# Patient Record
Sex: Male | Born: 2000 | Race: White | Hispanic: No | Marital: Single | Smoking: Never smoker
Health system: Southern US, Community
[De-identification: ages and names within clinical notes are randomized; demographics above are authoritative.]

## PROBLEM LIST (undated history)

## (undated) DIAGNOSIS — F419 Anxiety disorder, unspecified: Secondary | ICD-10-CM

## (undated) DIAGNOSIS — T7840XA Allergy, unspecified, initial encounter: Secondary | ICD-10-CM

## (undated) DIAGNOSIS — R55 Syncope and collapse: Secondary | ICD-10-CM

## (undated) DIAGNOSIS — K219 Gastro-esophageal reflux disease without esophagitis: Secondary | ICD-10-CM

## (undated) DIAGNOSIS — J45909 Unspecified asthma, uncomplicated: Secondary | ICD-10-CM

## (undated) DIAGNOSIS — F32A Depression, unspecified: Secondary | ICD-10-CM

## (undated) DIAGNOSIS — F332 Major depressive disorder, recurrent severe without psychotic features: Secondary | ICD-10-CM

## (undated) DIAGNOSIS — R45851 Suicidal ideations: Secondary | ICD-10-CM

## (undated) HISTORY — PX: TYMPANOSTOMY TUBE PLACEMENT: SHX32

## (undated) HISTORY — DX: Major depressive disorder, recurrent severe without psychotic features: F33.2

## (undated) HISTORY — DX: Suicidal ideations: R45.851

## (undated) HISTORY — PX: WISDOM TOOTH EXTRACTION: SHX21

## (undated) HISTORY — DX: Gastro-esophageal reflux disease without esophagitis: K21.9

## (undated) HISTORY — DX: Allergy, unspecified, initial encounter: T78.40XA

## (undated) HISTORY — PX: CIRCUMCISION: SUR203

## (undated) HISTORY — DX: Unspecified asthma, uncomplicated: J45.909

## (undated) HISTORY — PX: ADENOIDECTOMY AND MYRINGOTOMY WITH TUBE PLACEMENT: SHX5714

## (undated) HISTORY — PX: TYMPANOPLASTY: SHX33

## (undated) HISTORY — DX: Syncope and collapse: R55

## (undated) HISTORY — DX: Depression, unspecified: F32.A

## (undated) HISTORY — DX: Anxiety disorder, unspecified: F41.9

---
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
## 2020-06-26 ENCOUNTER — Telehealth: Payer: Self-pay | Admitting: Pediatrics

## 2020-06-26 NOTE — Telephone Encounter (Signed)
Yes, this is okay. Thanks for checking!

## 2020-06-26 NOTE — Telephone Encounter (Signed)
Pt called stating that you spoke with his mom [name] About pt establishing with you  Ok to schedule

## 2020-06-28 NOTE — Telephone Encounter (Signed)
Called patient and scheduled new patient appointment.

## 2020-07-15 ENCOUNTER — Encounter: Payer: Self-pay | Admitting: Primary Care

## 2020-07-15 ENCOUNTER — Other Ambulatory Visit: Payer: Self-pay

## 2020-07-15 ENCOUNTER — Ambulatory Visit (INDEPENDENT_AMBULATORY_CARE_PROVIDER_SITE_OTHER): Payer: BC Managed Care – PPO | Admitting: Primary Care

## 2020-07-15 VITALS — BP 116/82 | HR 79 | Temp 98.3°F | Ht 69.69 in | Wt 139.2 lb

## 2020-07-15 DIAGNOSIS — F419 Anxiety disorder, unspecified: Secondary | ICD-10-CM | POA: Insufficient documentation

## 2020-07-15 DIAGNOSIS — F900 Attention-deficit hyperactivity disorder, predominantly inattentive type: Secondary | ICD-10-CM | POA: Diagnosis not present

## 2020-07-15 DIAGNOSIS — Z23 Encounter for immunization: Secondary | ICD-10-CM

## 2020-07-15 DIAGNOSIS — K219 Gastro-esophageal reflux disease without esophagitis: Secondary | ICD-10-CM | POA: Diagnosis not present

## 2020-07-15 DIAGNOSIS — F32A Depression, unspecified: Secondary | ICD-10-CM

## 2020-07-15 DIAGNOSIS — Z0000 Encounter for general adult medical examination without abnormal findings: Secondary | ICD-10-CM

## 2020-07-15 DIAGNOSIS — Q676 Pectus excavatum: Secondary | ICD-10-CM

## 2020-07-15 NOTE — Progress Notes (Signed)
Subjective:    Patient ID: Jesse Schultz, male    DOB: 2000-12-29, 19 y.o.   MRN: [id number]  HPI  This visit occurred during the SARS-CoV-2 public health emergency.  Safety protocols were in place, including screening questions prior to the visit, additional usage of staff PPE, and extensive cleaning of exam room while observing appropriate contact time as indicated for disinfecting solutions.   Mr. Jesse Schultz is a 19 year old male who presents today to establish care and for complete physical. Will obtain/review records.  1) ADHD/Depression/Anxiety: Currently managed on Abilify 5 mg, Zoloft 100 mg, hydroxyzine 10 mg PRN. Following with Dr. Elpidio Anis in [place] for medication management and also his therapist every few weeks. He does have residual symptoms of anxiety and depression, his therapist is questioning whether he may have bipolar disorder.  2) Pectus excavatum:  Underwent NUSS procedure on 09/18/19 through [facility]. Following with Dr. Milana Kidney with last visit being in October 2021. The rod is planned to be removed in spring 2023. Overall he endorses doing well since surgery.  3) GERD: Chronic, now managed on omeprazole 20 mg for which he takes daily. Symptoms include belching, throat fullness. He was once managed on Zantac 150 milligrams which was effective until it was recalled.   Immunizations: -Tetanus: Completed in 2020 -Influenza: Due today -HPV: Completed series  Diet: He endorses a fair diet.  Exercise: No regular exercise  Eye exam: Completed in 2020 Dental exam: Completes annually    Review of Systems  Constitutional: Negative for unexpected weight change.  HENT: Negative for rhinorrhea.   Eyes: Negative for visual disturbance.  Respiratory: Negative for cough and shortness of breath.   Cardiovascular: Negative for chest pain.  Gastrointestinal: Negative for constipation and diarrhea.  Genitourinary: Negative for difficulty urinating.  Musculoskeletal:  Negative for arthralgias and myalgias.  Skin: Negative for rash.  Allergic/Immunologic: Negative for environmental allergies.  Neurological: Negative for dizziness and headaches.  Psychiatric/Behavioral: The patient is nervous/anxious.        Past Medical History:  Diagnosis Date  . Allergy   . Anxiety   . Asthma   . Depression   . Fainting spell   . GERD (gastroesophageal reflux disease)      Social History   Socioeconomic History  . Marital status: Single    Spouse name: Not on file  . Number of children: Not on file  . Years of education: Not on file  . Highest education level: Not on file  Occupational History  . Not on file  Tobacco Use  . Smoking status: Never Smoker  . Smokeless tobacco: Never Used  Substance and Sexual Activity  . Alcohol use: No  . Drug use: No  . Sexual activity: Never    Birth control/protection: Abstinence  Other Topics Concern  . Not on file  Social History Narrative   Cadell is in ninth grade at [employer]. He is meeting most of the goals on his IEP.He struggles with math.   Wilber lives with his parents, and two brothers.   Social Determinants of Health   Financial Resource Strain:   . Difficulty of Paying Living Expenses: Not on file  Food Insecurity:   . Worried About Programme researcher, broadcasting/film/video in the Last Year: Not on file  . Ran Out of Food in the Last Year: Not on file  Transportation Needs:   . Lack of Transportation (Medical): Not on file  . Lack of Transportation (Non-Medical): Not on file  Physical Activity:   . Days of Exercise per Week: Not on file  . Minutes of Exercise per Session: Not on file  Stress:   . Feeling of Stress : Not on file  Social Connections:   . Frequency of Communication with Friends and Family: Not on file  . Frequency of Social Gatherings with Friends and Family: Not on file  . Attends Religious Services: Not on file  . Active Member of Clubs or Organizations: Not on file  . Attends Occupational hygienist Meetings: Not on file  . Marital Status: Not on file  Intimate Partner Violence:   . Fear of Current or Ex-Partner: Not on file  . Emotionally Abused: Not on file  . Physically Abused: Not on file  . Sexually Abused: Not on file    Past Surgical History:  Procedure Laterality Date  . ADENOIDECTOMY AND MYRINGOTOMY WITH TUBE PLACEMENT    . CIRCUMCISION    . PECTUS BAR INSERTIONPEDIATRIC  2021  . TYMPANOPLASTY    . TYMPANOSTOMY TUBE PLACEMENT    . WISDOM TOOTH EXTRACTION      No family history on file.  Allergies  Allergen Reactions  . Other Cough    Seasonal Allergies in Spring & Fall  Seasonal Allergies in Spring & Fall    Current Outpatient Medications on File Prior to Visit  Medication Sig Dispense Refill  . acetaminophen (TYLENOL) 500 MG tablet Take 500 mg by mouth every 6 (six) hours as needed.    . ARIPiprazole (ABILIFY) 5 MG tablet Take by mouth.    . hydrOXYzine (ATARAX/VISTARIL) 10 MG tablet Take 10 mg by mouth 2 (two) times daily.    Marland Kitchen ibuprofen (ADVIL,MOTRIN) 200 MG tablet Take 200 mg by mouth every 6 (six) hours as needed.    . loratadine (CLARITIN) 10 MG tablet Take 10 mg by mouth daily.    Marland Kitchen omeprazole (PRILOSEC) 20 MG capsule Take 20 mg by mouth daily.    . sertraline (ZOLOFT) 100 MG tablet Take by mouth.     No current facility-administered medications on file prior to visit.    BP 116/82 (BP Location: Left Arm, Patient Position: Sitting)   Pulse 79   Temp 98.3 F (36.8 C)   Ht 5' 9.69" (1.77 m)   Wt 139 lb 4 oz (63.2 kg)   SpO2 96%   BMI 20.16 kg/m    Objective:   Physical Exam HENT:     Right Ear: Tympanic membrane and ear canal normal.     Left Ear: Tympanic membrane and ear canal normal.  Eyes:     Pupils: Pupils are equal, round, and reactive to light.  Cardiovascular:     Rate and Rhythm: Normal rate and regular rhythm.  Pulmonary:     Effort: Pulmonary effort is normal.     Breath sounds: Normal breath sounds.    Abdominal:     General: Bowel sounds are normal.     Palpations: Abdomen is soft.     Tenderness: There is no abdominal tenderness.  Musculoskeletal:        General: Normal range of motion.     Cervical back: Neck supple.  Skin:    General: Skin is warm and dry.  Neurological:     Mental Status: He is alert and oriented to person, place, and time.     Cranial Nerves: No cranial nerve deficit.     Deep Tendon Reflexes:     Reflex Scores:  Patellar reflexes are 2+ on the right side and 2+ on the left side. Psychiatric:        Mood and Affect: Mood normal.            Assessment & Plan:

## 2020-07-15 NOTE — Assessment & Plan Note (Signed)
S/P NUSS procedure in January 2021, following with [facility].

## 2020-07-15 NOTE — Assessment & Plan Note (Signed)
Immunizations up-to-date. Encouraged healthy diet regular exercise.  Discussed safety concerns for this age group.  Exam today unremarkable. Follow-up in 1 year for CPE.

## 2020-07-15 NOTE — Assessment & Plan Note (Signed)
Following with psychiatry and therapy.  Continue current regimen. 

## 2020-07-15 NOTE — Assessment & Plan Note (Signed)
Chronic, is on PPI therapy daily. Discussed triggers for GERD.

## 2020-07-15 NOTE — Assessment & Plan Note (Signed)
Not currently on stimulant treatment. Follows with psychiatry and therapy.

## 2020-07-15 NOTE — Patient Instructions (Signed)
It's important to improve your diet by reducing consumption of fast food, fried food, processed snack foods, sugary drinks. Increase consumption of fresh vegetables and fruits, whole grains, water.  Ensure you are drinking 64 ounces of water daily.  Start exercising. You should be getting 150 minutes of activity weekly.  It was a pleasure to meet you today! Please don't hesitate to call or message me with any questions. Welcome to [facility]!

## 2020-07-28 ENCOUNTER — Encounter (HOSPITAL_COMMUNITY): Payer: Self-pay | Admitting: Emergency Medicine

## 2020-07-28 ENCOUNTER — Emergency Department (HOSPITAL_COMMUNITY)
Admission: EM | Admit: 2020-07-28 | Discharge: 2020-07-29 | Disposition: A | Discharge status: Home / Self Care | Payer: BC Managed Care – PPO | Attending: Emergency Medicine | Admitting: Emergency Medicine

## 2020-07-28 DIAGNOSIS — F332 Major depressive disorder, recurrent severe without psychotic features: Secondary | ICD-10-CM | POA: Diagnosis not present

## 2020-07-28 DIAGNOSIS — J45909 Unspecified asthma, uncomplicated: Secondary | ICD-10-CM | POA: Diagnosis not present

## 2020-07-28 DIAGNOSIS — R45851 Suicidal ideations: Secondary | ICD-10-CM | POA: Insufficient documentation

## 2020-07-28 DIAGNOSIS — S0081XA Abrasion of other part of head, initial encounter: Secondary | ICD-10-CM | POA: Insufficient documentation

## 2020-07-28 DIAGNOSIS — Z046 Encounter for general psychiatric examination, requested by authority: Secondary | ICD-10-CM | POA: Diagnosis not present

## 2020-07-28 DIAGNOSIS — S80811A Abrasion, right lower leg, initial encounter: Secondary | ICD-10-CM | POA: Diagnosis not present

## 2020-07-28 DIAGNOSIS — W260XXA Contact with knife, initial encounter: Secondary | ICD-10-CM | POA: Diagnosis not present

## 2020-07-28 DIAGNOSIS — Z20822 Contact with and (suspected) exposure to covid-19: Secondary | ICD-10-CM | POA: Diagnosis not present

## 2020-07-28 DIAGNOSIS — S80812A Abrasion, left lower leg, initial encounter: Secondary | ICD-10-CM | POA: Insufficient documentation

## 2020-07-28 DIAGNOSIS — S0990XA Unspecified injury of head, initial encounter: Secondary | ICD-10-CM | POA: Diagnosis present

## 2020-07-28 DIAGNOSIS — S30811A Abrasion of abdominal wall, initial encounter: Secondary | ICD-10-CM | POA: Diagnosis not present

## 2020-07-28 LAB — COMPREHENSIVE METABOLIC PANEL
ALT: 18 U/L (ref 0–44)
AST: 23 U/L (ref 15–41)
Albumin: 5.4 g/dL — ABNORMAL HIGH (ref 3.5–5.0)
Alkaline Phosphatase: 88 U/L (ref 38–126)
Anion gap: 11 (ref 5–15)
BUN: 10 mg/dL (ref 6–20)
CO2: 28 mmol/L (ref 22–32)
Calcium: 9.3 mg/dL (ref 8.9–10.3)
Chloride: 100 mmol/L (ref 98–111)
Creatinine, Ser: 0.56 mg/dL — ABNORMAL LOW (ref 0.61–1.24)
GFR, Estimated: >60 mL/min (ref >60)
Glucose, Bld: 105 mg/dL — ABNORMAL HIGH (ref 70–99)
Potassium: 3.4 mmol/L — ABNORMAL LOW (ref 3.5–5.1)
Sodium: 139 mmol/L (ref 135–145)
Total Bilirubin: 0.8 mg/dL (ref 0.3–1.2)
Total Protein: 8.4 g/dL — ABNORMAL HIGH (ref 6.5–8.1)

## 2020-07-28 LAB — CBC WITH DIFFERENTIAL/PLATELET
Abs Immature Granulocytes: 0.02 10*3/uL (ref 0.00–0.07)
Basophils Absolute: 0 10*3/uL (ref 0.0–0.1)
Basophils Relative: 0 %
Eosinophils Absolute: 0 10*3/uL (ref 0.0–0.5)
Eosinophils Relative: 0 %
HCT: 44.3 % (ref 39.0–52.0)
Hemoglobin: 15.2 g/dL (ref 13.0–17.0)
Immature Granulocytes: 0 %
Lymphocytes Relative: 13 %
Lymphs Abs: 1.2 10*3/uL (ref 0.7–4.0)
MCH: 31.6 pg (ref 26.0–34.0)
MCHC: 34.3 g/dL (ref 30.0–36.0)
MCV: 92.1 fL (ref 80.0–100.0)
Monocytes Absolute: 0.3 10*3/uL (ref 0.1–1.0)
Monocytes Relative: 3 %
Neutro Abs: 8.2 10*3/uL — ABNORMAL HIGH (ref 1.7–7.7)
Neutrophils Relative %: 84 %
Platelets: 193 10*3/uL (ref 150–400)
RBC: 4.81 MIL/uL (ref 4.22–5.81)
RDW: 11.9 % (ref 11.5–15.5)
WBC: 9.8 10*3/uL (ref 4.0–10.5)
nRBC: 0 % (ref 0.0–0.2)

## 2020-07-28 LAB — RESP PANEL BY RT-PCR (FLU A&B, COVID) ARPGX2
Influenza A by PCR: NEGATIVE
Influenza B by PCR: NEGATIVE
SARS Coronavirus 2 by RT PCR: NEGATIVE

## 2020-07-28 LAB — RAPID URINE DRUG SCREEN, HOSP PERFORMED
Amphetamines: NOT DETECTED
Barbiturates: NOT DETECTED
Benzodiazepines: NOT DETECTED
Cocaine: NOT DETECTED
Opiates: NOT DETECTED
Tetrahydrocannabinol: NOT DETECTED

## 2020-07-28 LAB — ETHANOL: Alcohol, Ethyl (B): 162 mg/dL — ABNORMAL HIGH (ref <10)

## 2020-07-28 MED ORDER — ALUM & MAG HYDROXIDE-SIMETH 200-200-20 MG/5ML PO SUSP: 30.0000 mL | Freq: Four times a day (QID) | ORAL | Status: DC | PRN

## 2020-07-28 MED ORDER — HALOPERIDOL 5 MG PO TABS: 5.0000 mg | ORAL_TABLET | Freq: Four times a day (QID) | ORAL | Status: DC | PRN

## 2020-07-28 MED ORDER — ONDANSETRON HCL 4 MG PO TABS: 4.0000 mg | ORAL_TABLET | Freq: Three times a day (TID) | ORAL | Status: DC | PRN

## 2020-07-28 MED ORDER — BENZTROPINE MESYLATE 1 MG PO TABS: 1.0000 mg | ORAL_TABLET | Freq: Four times a day (QID) | ORAL | Status: DC | PRN

## 2020-07-28 MED ORDER — IBUPROFEN 200 MG PO TABS: 600.0000 mg | ORAL_TABLET | Freq: Three times a day (TID) | ORAL | Status: DC | PRN

## 2020-07-28 NOTE — ED Notes (Signed)
Pt being assessed by TTS at this time.  NADN.

## 2020-07-28 NOTE — ED Notes (Signed)
Pt ambulatory to the bathroom with steady gait and w/o assistance Pt providing urine specimen

## 2020-07-28 NOTE — ED Provider Notes (Addendum)
[facility]-EMERGENCY DEPT Provider Note   CSN: [id number] Arrival date & time: 07/28/20  1538     History Chief Complaint  Patient presents with  . Suicidal  . Laceration    Jesse Schultz is a 19 y.o. male.  The history is provided by the patient and the police.  Laceration  Jesse Schultz is a 19 y.o. male who presents to the Emergency Department complaining of suicidal, IVC. He presents the emergency department by [employer] for evaluation of suicidal ideation and attempt. He states that he has been wanting to kill himself since he was born. Today he attempted to cut his wrist with a knife and attempted to jump through a window. He has ongoing suicidal ideation on ED presentation. He does report drinking a large amount of alcohol today. No recent illnesses. He has been vaccinated for COVID-19. He is right-hand dominant. Tetanus shot is up to date.    Past Medical History:  Diagnosis Date  . Allergy   . Anxiety   . Asthma   . Depression   . Fainting spell   . GERD (gastroesophageal reflux disease)     Patient Active Problem List   Diagnosis Date Noted  . Anxiety and depression 07/15/2020  . GERD (gastroesophageal reflux disease) 07/15/2020  . Preventative health care 07/15/2020  . Pectus excavatum 07/15/2020  . Transient alteration of awareness 07/16/2015  . 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 ADENOIDECTOMY AND MYRINGOTOMY WITH TUBE PLACEMENT    . CIRCUMCISION    . PECTUS BAR INSERTIONPEDIATRIC  2021  . TYMPANOPLASTY    . TYMPANOSTOMY TUBE PLACEMENT    . WISDOM TOOTH EXTRACTION         Family History  Problem Relation Age of Onset  . Allergies Mother   . Asthma Maternal Grandmother   . Allergies Maternal Grandmother   . Heart disease Maternal Grandfather     Social History   Tobacco Use  . Smoking status: Never Smoker  . Smokeless tobacco:  Never Used  Substance Use Topics  . Alcohol use: No  . Drug use: No    Home Medications Prior to Admission medications   Medication Sig Start Date End Date Taking? Authorizing Provider  acetaminophen (TYLENOL) 500 MG tablet Take 500 mg by mouth every 6 (six) hours as needed.    [provider]  ARIPiprazole (ABILIFY) 5 MG tablet Take by mouth. 09/09/19   [provider]  hydrOXYzine (ATARAX/VISTARIL) 10 MG tablet Take 10 mg by mouth 2 (two) times daily. 07/04/20   [provider]  ibuprofen (ADVIL,MOTRIN) 200 MG tablet Take 200 mg by mouth every 6 (six) hours as needed.    [provider]  loratadine (CLARITIN) 10 MG tablet Take 10 mg by mouth daily.    [provider]  omeprazole (PRILOSEC) 20 MG capsule Take 20 mg by mouth daily.    [provider]  sertraline (ZOLOFT) 100 MG tablet Take by mouth. 09/05/19   [provider]    Allergies    Other  Review of Systems   Review of Systems  All other systems reviewed and are negative.   Physical Exam Updated Vital Signs BP 122/79 (BP Location: Left Arm)   Pulse 97   Temp (!) 97.5 F (36.4 C) (Oral)   Resp 16   SpO2 99%   Physical Exam Vitals and nursing note reviewed.  Constitutional:  Appearance: He is well-developed.  HENT:     Head: Normocephalic.     Comments: Superficial abrasion to Central forehead Cardiovascular:     Rate and Rhythm: Normal rate and regular rhythm.     Heart sounds: No murmur heard.   Pulmonary:     Effort: Pulmonary effort is normal. No respiratory distress.     Breath sounds: Normal breath sounds.  Abdominal:     Palpations: Abdomen is soft.     Tenderness: There is no abdominal tenderness. There is no guarding or rebound.  Musculoskeletal:        General: No tenderness.     Comments: Multiple superficial abrasions to bilateral upper extremities as well as lower abdominal wall.  Skin:    General: Skin is warm and dry.    Neurological:     Mental Status: He is alert and oriented to person, place, and time.  Psychiatric:     Comments: Appears mildly agitated, reports SI.     ED Results / Procedures / Treatments   Labs (all labs ordered are listed, but only abnormal results are displayed) Labs Reviewed  COMPREHENSIVE METABOLIC PANEL - Abnormal; Notable for the following components:      Result Value   Potassium 3.4 (*)    Glucose, Bld 105 (*)    Creatinine, Ser 0.56 (*)    Total Protein 8.4 (*)    Albumin 5.4 (*)    All other components within normal limits  ETHANOL - Abnormal; Notable for the following components:   Alcohol, Ethyl (B) 162 (*)    All other components within normal limits  CBC WITH DIFFERENTIAL/PLATELET - Abnormal; Notable for the following components:   Neutro Abs 8.2 (*)    All other components within normal limits  RESP PANEL BY RT-PCR (FLU A&B, COVID) ARPGX2  RAPID URINE DRUG SCREEN, HOSP PERFORMED    EKG None  Radiology No results found.  Procedures Procedures (including critical care time)  Medications Ordered in ED Medications  haloperidol (HALDOL) tablet 5 mg (has no administration in time range)    And  benztropine (COGENTIN) tablet 1 mg (has no administration in time range)  ibuprofen (ADVIL) tablet 600 mg (has no administration in time range)  ondansetron (ZOFRAN) tablet 4 mg (has no administration in time range)  alum & mag hydroxide-simeth (MAALOX/MYLANTA) 200-200-20 MG/5ML suspension 30 mL (has no administration in time range)    ED Course  I have reviewed the triage vital signs and the nursing notes.  Pertinent labs & imaging results that were available during my care of the patient were reviewed by me and considered in my medical decision making (see chart for details).    MDM Rules/Calculators/A&P                         patient here under IVC by police for attempt to cut his wrist. He is mildly agitated intoxicated on evaluation. He has multiple  superficial abrasions and wounds to his arms and abdomen that do not require repair. Alcohol level is elevated at 162. He has been medically cleared for psychiatric evaluation and treatment. nal Clinical Impression(s) / ED Diagnoses Final diagnoses:  Suicidal ideation    Rx / DC Orders ED Discharge Orders    None       Tilden Fossa, MD 07/28/20 1755    Tilden Fossa, MD 07/28/20 1756

## 2020-07-28 NOTE — ED Notes (Signed)
Patient belongings are in triage cabinet. It contains pants.

## 2020-07-28 NOTE — BH Assessment (Signed)
Tele Assessment Note   Patient Name: Jesse Schultz MRN: [id number] Referring Physician: Tilden Fossa, MD Location of Patient: [facility], [facility] Location of Provider: Behavioral Health TTS Department  Jesse Schultz is an 19 y.o. single male who presents to [facility] via [facility] after being petitioned for involuntary commitment by his mother, Dahl Higinbotham [phone]. Affidavit and petition states: "Respondent suffers from depression and anxiety. He is prescribed Zoloft 100 mg - 1/2 tablet daily and Abilify 5 mg - 1 day. Respondent voiced that he wanted to die and wrote a suicide note. He has superficial cuts on his arms and legs and tried to run into a window. He is a danger to himself as he tried to hit his head on objects such as bricks, floors, and windows. Respondent drank a 12 pack of beers today."  Pt reports he attempted suicide today by cutting his wrists. He says he stopped because his parents intervened and because "it wasn't working." Pt reports this suicide attempt was in response to a cumulative stressors. He denies history of previous suicide attempts. Pt described his mood recently as "all over the place." Pt acknowledges symptoms including crying spells, social withdrawal, loss of interest in usual pleasures, fatigue, irritability, decreased concentration, decreased sleep, decreased appetite and feelings of guilt, worthlessness and hopelessness. Pt denies history of intentional self-injurious behaviors prior to today. He denies auditory or visual hallucinations. Pt denies current homicidal ideation. When asked about history of aggression, Pt states, "You need to ask my mom."  Pt reports he typically drinks 2-3 beers 2-3 times per month. He states he drank 12 cans of beer today. He reports he occasionally uses marijuana. He denies use of other substances. Pt's blood alcohol level is 162 and urine drug screen is negative.  Pt reports he lives with his parents  and there have been "typical" family conflicts. He says he also had an argument with his girlfriend regarding where she would be having Thanksgiving dinner. 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 He denies history of abuse or trauma. He denies legal problems. He denies access to firearms. Pt reports he is currently receiving outpatient therapy with [facility] and receives medication management through PCP, Dr Loyola Mast. He denies any history of inpatient psychiatric treatment.  Pt gave permission to speak with his mother, who accompanied Pt to [facility]. She reports Pt has a history of depression and anxiety. She says he was not doing his household obligations and that Pt "doesn't take correction well." She says Pt smelled intoxicated and became loud and argumentative. She says he was making suicidal statements, stating "I didn't asked to be born" and "Let me die." She states he repeatedly tried to run into a window to kill himself and had to be physically restrained. She says he was swinging and trying to hit her and his father. She reports Pt passed out. Eventually he woke and began vomiting. She says Pt has been loud and argumentative in the past but has not tried to hit them or actively kill himself.  Pt is dressed in hospital scrubs, alert and oriented x4. Pt speaks in a clear tone, at moderate volume and normal pace. Motor behavior appears normal. Eye contact is good. Pt's mood is depressed and anxious, affect is congruent with mood. Thought process is coherent and relevant. There is no indication Pt is currently responding to internal stimuli or experiencing delusional thought content. Pt was cooperative throughout assessment.  Diagnosis:  F33.2 Major depressive disorder, Recurrent episode, Severe F41.1 Generalized anxiety disorder  Past Medical History:  Past Medical History:  Diagnosis Date  . Allergy   . Anxiety   . Asthma   . Depression   . Fainting spell   . GERD  (gastroesophageal reflux disease)     Past Surgical History:  Procedure Laterality Date  . ADENOIDECTOMY AND MYRINGOTOMY WITH TUBE PLACEMENT    . CIRCUMCISION    . PECTUS BAR INSERTIONPEDIATRIC  2021  . TYMPANOPLASTY    . TYMPANOSTOMY TUBE PLACEMENT    . WISDOM TOOTH EXTRACTION      Family History:  Family History  Problem Relation Age of Onset  . Allergies Mother   . Asthma Maternal Grandmother   . Allergies Maternal Grandmother   . Heart disease Maternal Grandfather     Social History:  reports that he has never smoked. He has never used smokeless tobacco. He reports that he does not drink alcohol and does not use drugs.  Additional Social History:  Alcohol / Drug Use Pain Medications: Denies abuse Prescriptions: Denies abuse Over the Counter: Denies abuse History of alcohol / drug use?: Yes Longest period of sobriety (when/how long): Unknown Negative Consequences of Use:  (Pt denies) Withdrawal Symptoms:  (Pt denies) Substance #1 Name of Substance 1: Alcohol 1 - Age of First Use: unknown 1 - Amount (size/oz): 3 cans of beer 1 - Frequency: 2-3 times per month 1 - Duration: Ongoing 1 - Last Use / Amount: 07/28/2020, 12 cans of beer  CIWA: CIWA-Ar BP: 93/66 Pulse Rate: 89 COWS:    Allergies:  Allergies  Allergen Reactions  . Other Cough    Seasonal Allergies in Spring & Fall  Seasonal Allergies in Spring & Fall    Home Medications: (Not in a hospital admission)   OB/GYN Status:  No LMP for male patient.  General Assessment Data Location of Assessment: [facility] TTS Assessment: In system Is this a Tele or Face-to-Face Assessment?: Tele Assessment Is this an Initial Assessment or a Re-assessment for this encounter?: Initial Assessment Patient Accompanied by:: Parent Language Other than English: No Living Arrangements: Other (Comment) (Lives with parents) What gender do you identify as?: Male Date Telepsych consult ordered in [facility]: 07/28/20 Time Telepsych  consult ordered in [facility]: 1602 Marital status: Single Maiden name: NA Pregnancy Status: No Living Arrangements: Parent Can pt return to current living arrangement?: Yes Admission Status: Involuntary Petitioner: Family member Is patient capable of signing voluntary admission?: Yes Referral Source: Self/Family/Friend Insurance type: BCBS     Crisis Care Plan Living Arrangements: Parent Legal Guardian: Other: (Self) Name of Psychiatrist: None Name of Therapist: [facility]  Education Status Is patient currently in school?: No Is the patient employed, unemployed or receiving disability?: Employed  Risk to self with the past 6 months Suicidal Ideation: Yes-Currently Present Has patient been a risk to self within the past 6 months prior to admission? : Yes Suicidal Intent: Yes-Currently Present Has patient had any suicidal intent within the past 6 months prior to admission? : Yes Is patient at risk for suicide?: Yes Suicidal Plan?: Yes-Currently Present Has patient had any suicidal plan within the past 6 months prior to admission? : Yes Specify Current Suicidal Plan: Cut his wrist Access to Means: Yes Specify Access to Suicidal Means: Pt reports access to knife What has been your use of drugs/alcohol within the last 12 months?: Pt dranks 12 cans of beer today Previous Attempts/Gestures: No How many times?: 0  Other Self Harm Risks: None Triggers for Past Attempts: None known Intentional Self Injurious Behavior: None Family Suicide History: Yes ("A distant relative") Recent stressful life event(s): Conflict (Comment) (Conflicts with parents, girlfriend) Persecutory voices/beliefs?: No Depression: Yes Depression Symptoms: Despondent, Tearfulness, Isolating, Fatigue, Guilt, Feeling worthless/self pity, Feeling angry/irritable Substance abuse history and/or treatment for substance abuse?: No Suicide prevention information given to non-admitted patients: Not applicable  Risk to  Others within the past 6 months Homicidal Ideation: No Does patient have any lifetime risk of violence toward others beyond the six months prior to admission? : Yes (comment) Thoughts of Harm to Others: No Current Homicidal Intent: No Current Homicidal Plan: No Access to Homicidal Means: No Identified Victim: None History of harm to others?: No Assessment of Violence: On admission Violent Behavior Description: Trying to hit parents today. Does patient have access to weapons?: No Criminal Charges Pending?: No Does patient have a court date: No Is patient on probation?: No  Psychosis Hallucinations: None noted Delusions: None noted  Mental Status Report Appearance/Hygiene: In scrubs Eye Contact: Good Motor Activity: Freedom of movement Speech: Logical/coherent Level of Consciousness: Alert Mood: Depressed, Anxious Affect: Depressed, Anxious Anxiety Level: Moderate Thought Processes: Coherent, Relevant Judgement: Impaired Orientation: Person, Place, Time, Situation Obsessive Compulsive Thoughts/Behaviors: None  Cognitive Functioning Concentration: Normal Memory: Recent Intact, Remote Intact Is patient IDD: No Insight: Fair Impulse Control: Poor Appetite: Good Have you had any weight changes? : No Change Sleep: No Change Total Hours of Sleep: 7 Vegetative Symptoms: None  ADLScreening [facility] Assessment Services) Patient's cognitive ability adequate to safely complete daily activities?: Yes Patient able to express need for assistance with ADLs?: Yes Independently performs ADLs?: Yes (appropriate for developmental age)  Prior Inpatient Therapy Prior Inpatient Therapy: No  Prior Outpatient Therapy Prior Outpatient Therapy: Yes Prior Therapy Dates: Current Prior Therapy Facilty/Provider(s): [facility] Reason for Treatment: Depression, anxiety Does patient have an ACCT team?: No Does patient have Intensive In-House Services?  : No Does patient have [facility] services? :  No Does patient have P4CC services?: No  ADL Screening (condition at time of admission) Patient's cognitive ability adequate to safely complete daily activities?: Yes Is the patient deaf or have difficulty hearing?: No Does the patient have difficulty seeing, even when wearing glasses/contacts?: No Does the patient have difficulty concentrating, remembering, or making decisions?: No Patient able to express need for assistance with ADLs?: Yes Does the patient have difficulty dressing or bathing?: No Independently performs ADLs?: Yes (appropriate for developmental age) Does the patient have difficulty walking or climbing stairs?: No Weakness of Legs: None Weakness of Arms/Hands: None  Home Assistive Devices/Equipment Home Assistive Devices/Equipment: None    Abuse/Neglect Assessment (Assessment to be complete while patient is alone) Abuse/Neglect Assessment Can Be Completed: Yes Physical Abuse: Denies Verbal Abuse: Denies Sexual Abuse: Denies Exploitation of patient/patient's resources: Denies Self-Neglect: Denies     Merchant navy officerAdvance Directives (For Healthcare) Does Patient Have a Medical Advance Directive?: No Would patient like information on creating a medical advance directive?: No - Patient declined          Disposition: Gave clinical report to Otila BackEddie Nwoko, PA-C who recommended Pt be observed overnight and evaluated by psychiatry. Notified Tilden FossaElizabeth Rees, MD and Blima RichJerry Davidson, RN of recommendation.  Disposition Initial Assessment Completed for this Encounter: Yes  This service was provided via telemedicine using a 2-way, interactive audio and video technology.  Names of all persons participating in this telemedicine service and their role in this encounter. Name: Jesse Schultz  Role: Patient  Name: Yolanda Manges Role: Pt's mother/petitioner  Name: Shela Commons, [facility] Role: TTS counselor       Harlin Rain Patsy [place], [facility] Triage Specialist [phone]  Pamalee Leyden 07/28/2020 8:34 PM

## 2020-07-28 NOTE — ED Triage Notes (Addendum)
Patient here from home via [facility] department IVCd with complaints of suicidal ideation. When asked why patient stated "isn't it obvious". Lacerations noted to bilateral forearm, abrasions noted to forehead and abdomen. Patient has vomit all over clothes. States that he had 12 beers today.

## 2020-07-29 ENCOUNTER — Encounter (HOSPITAL_COMMUNITY): Payer: Self-pay | Admitting: Registered Nurse

## 2020-07-29 ENCOUNTER — Telehealth (HOSPITAL_COMMUNITY): Payer: Self-pay | Admitting: Professional

## 2020-07-29 DIAGNOSIS — R45851 Suicidal ideations: Secondary | ICD-10-CM | POA: Insufficient documentation

## 2020-07-29 DIAGNOSIS — F332 Major depressive disorder, recurrent severe without psychotic features: Secondary | ICD-10-CM

## 2020-07-29 NOTE — ED Notes (Signed)
Pt mother states she took home pts belongings last night. Pt has no belongings to take with him, pt mother brought clothes with her for him to change into.

## 2020-07-29 NOTE — ED Notes (Signed)
Psych consult bedside.

## 2020-07-29 NOTE — BH Assessment (Signed)
[facility] Assessment Progress Note  Per Shuvon Rankin, NP, this pt does not require psychiatric hospitalization at this time.  Pt presents under IVC initiated by pt's mother and upheld by EDP Tilden Fossa, MD, which has been rescinded by Nelly Rout, MD.  Pt is psychiatrically cleared.  Shuvon recommends the Partial Hospitalization Program (PHP) at the [facility] at [facility] for follow up, and pt agrees to plan.  This program is currently virtual and pt reports that he has technology in his home to support this.  This Clinical research associate has spoken to [employer], [facility], who has scheduled pt for an intake appointment on Monday 08/05/2020 at 14:00.  This has been included in pt's discharge instructions, along with contact information for Donia Guiles, LCSW who will be running the program.  I have obtained pt's -email address and phone number, and have forwarded them to [place] via e-mail.  Pt has been provided with a registration form for the Outpatient Clinic and has been asked to fill it out prior to the intake appointment.  EDP Kennis Carina, MD and pt's nurse, Lupe Carney, have been notified.  Doylene Canning, MA Triage Specialist [phone]

## 2020-07-29 NOTE — ED Notes (Signed)
Pt provided with breakfast

## 2020-07-29 NOTE — ED Notes (Signed)
An After Visit Summary was printed and given to the patient. °Discharge instructions given and no further questions at this time.  °Pt leaving with mother. °

## 2020-07-29 NOTE — Discharge Instructions (Signed)
For your behavioral health needs, you are advised to follow up with the Partial Hospitalizatio Program (PHP) at the [facility] at [place].  This program meets Monday - Friday from 9:00 am - 1:00 pm.  Due to Covid-19 this program is currently virtual.  You are scheduled for an intake appointment on Monday, August 05, 2020 at 2:00 pm.  If you have any questions, contact Donia Guiles, LCSW at the phone number indicated below:       [facility] at [facility]. [employer]. [address]      [place], [place] [address]      Contact person: Donia Guiles. LCSW      [phone]      [email or web address]
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
## 2020-07-29 NOTE — ED Notes (Signed)
Pt denies SI at this time. 

## 2020-08-05 ENCOUNTER — Other Ambulatory Visit: Payer: Self-pay

## 2020-08-05 ENCOUNTER — Other Ambulatory Visit (HOSPITAL_COMMUNITY): Payer: BC Managed Care – PPO | Attending: Psychiatry | Admitting: Licensed Clinical Social Worker

## 2020-08-05 DIAGNOSIS — F332 Major depressive disorder, recurrent severe without psychotic features: Secondary | ICD-10-CM

## 2020-08-07 ENCOUNTER — Other Ambulatory Visit (HOSPITAL_COMMUNITY): Payer: BC Managed Care – PPO | Admitting: Licensed Clinical Social Worker

## 2020-08-07 ENCOUNTER — Other Ambulatory Visit (HOSPITAL_COMMUNITY): Payer: BC Managed Care – PPO | Attending: Psychiatry | Admitting: Occupational Therapy

## 2020-08-07 ENCOUNTER — Encounter (HOSPITAL_COMMUNITY): Payer: Self-pay

## 2020-08-07 ENCOUNTER — Other Ambulatory Visit: Payer: Self-pay

## 2020-08-07 DIAGNOSIS — F332 Major depressive disorder, recurrent severe without psychotic features: Secondary | ICD-10-CM | POA: Insufficient documentation

## 2020-08-07 DIAGNOSIS — R4589 Other symptoms and signs involving emotional state: Secondary | ICD-10-CM

## 2020-08-07 DIAGNOSIS — R45851 Suicidal ideations: Secondary | ICD-10-CM | POA: Diagnosis not present

## 2020-08-07 DIAGNOSIS — R41844 Frontal lobe and executive function deficit: Secondary | ICD-10-CM

## 2020-08-07 DIAGNOSIS — Z79899 Other long term (current) drug therapy: Secondary | ICD-10-CM | POA: Diagnosis not present

## 2020-08-07 NOTE — Psych (Signed)
Virtual Visit via Video Note  I connected with Jesse Schultz on 08/05/20 at  2:00 PM EST by a video enabled telemedicine application and verified that I am speaking with the correct person using two identifiers.  Location: Patient: patient home Provider: clinical home office   I discussed the limitations of evaluation and management by telemedicine and the availability of in person appointments. The patient expressed understanding and agreed to proceed.  I discussed the assessment and treatment plan with the patient. The patient was provided an opportunity to ask questions and all were answered. The patient agreed with the plan and demonstrated an understanding of the instructions.   The patient was advised to call back or seek an in-person evaluation if the symptoms worsen or if the condition fails to improve as anticipated.  I provided 60 minutes of non-face-to-face time during this encounter.   Jesse Guiles, LCSW    Comprehensive Clinical Assessment (CCA) Note  08/07/2020 Jesse Schultz [id number]   CCA Biopsychosocial Intake/Chief Complaint:  Pt presents as referral from [facility] ED. Pt was IVC'd by his parents due to cutting for purpose of suicidal intent, agressive behaviors, and intoxication. Pt reports history of depression and past SI. Pt states he has a current therapist and has medication management by his PCP. Pt states he had a conflict with his parents and went to a dog sitting house and while there drank a case of beer, attempted to shoot himself however the gun did not work, and then cut himself. Pt states he returned home and his parents called for help due to his state and voicing SI. Pt states he is unsure whether his depression has been increasing or if the SI stemmed from the conflict with his parents. Pt states he does not believe his intoxication was the reason for his SI or actions. Pt denies SI since this event. Pt denies AVH and consistent substance  use.  Current Symptoms/Problems: Pt reports depressed mood, hopelessness, anhedonia, worthlessness, and describes passive SI.   Patient Reported Schizophrenia/Schizoaffective Diagnosis in Past: No   Strengths: Pt is willing to engage in treatment, has safe living environment, some support  Preferences: No data recorded Abilities: No data recorded  Type of Services Patient Feels are Needed: intensive   Initial Clinical Notes/Concerns: Pt is short in speech and not forthcoming with information. It is unclear if he is guarded or has low insight.   Mental Health Symptoms Depression:  Hopelessness;Irritability;Worthlessness;Change in energy/activity;Fatigue   Duration of Depressive symptoms: Greater than two weeks   Mania:  None   Anxiety:   Restlessness;Worrying   Psychosis:  None   Duration of Psychotic symptoms: No data recorded  Trauma:  None   Obsessions:  None   Compulsions:  None   Inattention:  None   Hyperactivity/Impulsivity:  N/A   Oppositional/Defiant Behaviors:  None   Emotional Irregularity:  No data recorded  Other Mood/Personality Symptoms:  No data recorded   Mental Status Exam Appearance and self-care  Stature:  Average   Weight:  Thin   Clothing:  Casual   Grooming:  Normal   Cosmetic use:  None   Posture/gait:  Normal   Motor activity:  Not Remarkable   Sensorium  Attention:  Normal   Concentration:  Scattered   Orientation:  X5   Recall/memory:  Normal   Affect and Mood  Affect:  Depressed   Mood:  Depressed   Relating  Eye contact:  Avoided   Facial expression:  Depressed  Attitude toward examiner:  Guarded;Uninterested;Cooperative   Thought and Language  Speech flow: Normal   Thought content:  Appropriate to Mood and Circumstances   Preoccupation:  None   Hallucinations:  None   Organization:  No data recorded  [employer] of Knowledge:  Average   Intelligence:  Average   Abstraction:   Functional   Judgement:  Fair   Dance movement psychotherapist:  Realistic   Insight:  Poor   Decision Making:  Impulsive   Social Functioning  Social Maturity:  No data recorded  Social Judgement:  No data recorded  Stress  Stressors:  Family conflict;Transitions;Work   Coping Ability:  Deficient supports   Skill Deficits:  Self-care   Supports:  Friends/Service system;Family     Religion: Religion/Spirituality Are You A Religious Person?: No  Leisure/Recreation: Leisure / Recreation Do You Have Hobbies?: Yes Leisure and Hobbies: music  Exercise/Diet: Exercise/Diet Do You Exercise?: No Have You Gained or Lost A Significant Amount of Weight in the Past Six Months?: No Do You Follow a Special Diet?: No Do You Have Any Trouble Sleeping?: No   CCA Employment/Education Employment/Work Situation: Employment / Work Situation Employment situation: Employed 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 Patient's job has been impacted by current illness: Yes Describe how patient's job has been impacted: Pt has been out of work due to symptoms Has patient ever been in the [employer]?: No  Education: Education Is Patient Currently Attending School?: No Did Garment/textile technologist From [employer]?: Yes Patient's Education Has Been Impacted by Current Illness: No   CCA Family/Childhood History Family and Relationship History: Family history Marital status: Single Does patient have children?: No  Childhood History:  Childhood History By whom was/is the patient raised?: Both parents Patient's description of current relationship with people who raised him/her: Pt reports mom is supportive and dad is distant Did patient suffer any verbal/emotional/physical/sexual abuse as a child?: No Did patient suffer from severe childhood neglect?: No Has patient ever been sexually abused/assaulted/raped as an adolescent or adult?: No Was the patient ever a victim of a crime  or a disaster?: No Witnessed domestic violence?: No Has patient been affected by domestic violence as an adult?: No  Child/Adolescent Assessment:     CCA Substance Use Alcohol/Drug Use: Alcohol / Drug Use Pain Medications: See MAR Prescriptions: see MAR Over the Counter: see MAR History of alcohol / drug use?: No history of alcohol / drug abuse (Pt reports drinking socially 2-3x/month and denies diagnostic criteria for alcohol and any other substances)        ASAM's:  Six Dimensions of Multidimensional Assessment  Dimension 1:  Acute Intoxication and/or Withdrawal Potential:      Dimension 2:  Biomedical Conditions and Complications:      Dimension 3:  Emotional, Behavioral, or Cognitive Conditions and Complications:     Dimension 4:  Readiness to Change:     Dimension 5:  Relapse, Continued use, or Continued Problem Potential:     Dimension 6:  Recovery/Living Environment:     ASAM Severity Score:    ASAM Recommended Level of Treatment:     Substance use Disorder (SUD)  Recommendations for Services/Supports/Treatments: Recommendations for Services/Supports/Treatments Recommendations For Services/Supports/Treatments: Partial Hospitalization (Pt is recommended PHP due to increase in symptomology and high risk factors)  DSM5 Diagnoses: Patient Active Problem List   Diagnosis Date Noted  . Suicidal ideation   . MDD (major depressive disorder), recurrent severe, without psychosis (HCC)   .  Anxiety and depression 07/15/2020  . GERD (gastroesophageal reflux disease) 07/15/2020  . Preventative health care 07/15/2020  . Pectus excavatum 07/15/2020  . Transient alteration of awareness 07/16/2015  . 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

## 2020-08-07 NOTE — Progress Notes (Signed)
Virtual Visit via Video Note  I connected with Noelle Hoogland Joye on 08/07/20 at  9:00 AM EST by a video enabled telemedicine application and verified that I am speaking with the correct person using two identifiers.  Location: Patient: Home Provider: Home   I discussed the limitations of evaluation and management by telemedicine and the availability of in person appointments. The patient expressed understanding and agreed to proceed.   I discussed the assessment and treatment plan with the patient. The patient was provided an opportunity to ask questions and all were answered. The patient agreed with the plan and demonstrated an understanding of the instructions.   The patient was advised to call back or seek an in-person evaluation if the symptoms worsen or if the condition fails to improve as anticipated.  I provided 15 minutes of non-face-to-face time during this encounter.   [name], NP    Behavioral Health Partial Program Assessment Note  Date: 08/07/2020 Name: FONTAINE HEHL MRN: [id number]    HPI: Jaquane Boughner is a 19 y.o. Caucasian and male presents with depression and suicidal ideations. Ryden presented flat, guarded and depressed.  Patient reports he attempted to cut his wrists to end it all.  Reports chronic suicidal ideations without previous attempts.  States things became intense and " I cut my wrist."  Reported strained relationship with tenderness family.  Patient did not elaborate and is very limited throughout this assessment.  Patient was enrolled in partial psychiatric program on 08/07/20.  Per EPD assessment note: MANUEL DALL is a 19 y.o. male who presents to the Emergency Department complaining of suicidal, IVC. He presents the emergency department by [facility] for evaluation of suicidal ideation and attempt. He states that he has been wanting to kill himself since he was born. Today he attempted to cut his wrist with a knife and attempted to jump through a  window. He has ongoing suicidal ideation on ED presentation. He does report drinking a large amount of alcohol today.   Primary complaints include: agitation, depression worse, feeling depressed and feeling suicidal.  Onset of symptoms was gradual with gradually worsening course since that time. Psychosocial Stressors include the following: family.   I have reviewed the following documentation dated 08/07/2020: past medical history and past social and family history  Complaints of Pain: nonear Past Psychiatric History:  Therapy, Out Patient   Currently in treatment with Abilify 5 mg and Zoloft 100 mg daily  Substance Abuse History: alcohol Use of Alcohol: denied Use of Caffeine: denies use Use of over the counter:   Past Surgical History:  Procedure Laterality Date  . ADENOIDECTOMY AND MYRINGOTOMY WITH TUBE PLACEMENT    . CIRCUMCISION    . PECTUS BAR INSERTIONPEDIATRIC  2021  . TYMPANOPLASTY    . TYMPANOSTOMY TUBE PLACEMENT    . WISDOM TOOTH EXTRACTION      Past Medical History:  Diagnosis Date  . Allergy   . Anxiety   . Asthma   . Depression   . Fainting spell   . GERD (gastroesophageal reflux disease)    Outpatient Encounter Medications as of 08/07/2020  Medication Sig Note  . acetaminophen (TYLENOL) 500 MG tablet Take 500 mg by mouth every 6 (six) hours as needed. 07/29/2020: Takes PRN  . ARIPiprazole (ABILIFY) 5 MG tablet Take by mouth.   Marland Kitchen ibuprofen (ADVIL,MOTRIN) 200 MG tablet Take 200 mg by mouth every 6 (six) hours as needed. 07/29/2020: Takes PRN  . loratadine (CLARITIN) 10 MG tablet Take  10 mg by mouth daily.   Marland Kitchen omeprazole (PRILOSEC) 20 MG capsule Take 20 mg by mouth daily.   . sertraline (ZOLOFT) 100 MG tablet Take by mouth.    No facility-administered encounter medications on file as of 08/07/2020.   Allergies  Allergen Reactions  . Other Cough    Seasonal Allergies in Spring & Fall  Seasonal Allergies in Spring & Fall    Social History   Tobacco  Use  . Smoking status: Never Smoker  . Smokeless tobacco: Never Used  Substance Use Topics  . Alcohol use: No   Functioning Relationships: good support system Education: Other (Specify any learning disability, behavioral disorder, special education needs, difficulty reading Other Pertinent History: None Family History  Problem Relation Age of Onset  . Allergies Mother   . Asthma Maternal Grandmother   . Allergies Maternal Grandmother   . Heart disease Maternal Grandfather      Review of Systems Constitutional: negative  Objective:  There were no vitals filed for this visit.  Physical Exam:   Mental Status Exam: Appearance:  Well groomed Psychomotor::  Within Normal Limits Attention span and concentration: Normal Behavior: cooperative and adequate rapport can be established Speech:  slow Mood:  depressed Affect:  blunted Thought Process:  Coherent Thought Content:  Logical Orientation:  person, place and time/date Cognition:  grossly intact Insight:  Fair Judgment:  Fair Estimate of Intelligence: Average Fund of knowledge: Aware of current events Memory: Recent and remote intact Abnormal movements: None Gait and station: Normal  Assessment:  Diagnosis: No primary diagnosis found. No diagnosis found.  Indications for admission: inpatient care required if not in partial hospital program  Plan: patient enrolled in Partial Hospitalization Program, patient's current medications are to be continued, a comprehensive treatment plan will be developed and side effects of medications have been reviewed with patient  Treatment options and alternatives reviewed with patient and patient understands the above plan. Treatment plan was reviewed and agreed upon by NP T. Melvyn Neth and patient Adewale Pucillo need for group service .    [name], NP

## 2020-08-07 NOTE — Progress Notes (Signed)
Spoke with patient via Webex video call, used 2 identifiers to correctly identify patient. States he was recommended by [facility] after a suicide attempt. He cut his arms, legs, and chest with a knife and spent 1 night at [facility]. This was his first attempt and he was never a cutter. His stressors include home life and work. States his parents argue with each other a lot. He sees a therapist about once a month. On scale 1-10 as 10 being worst he rates depression at 2 and anxiety at 2. Denies SI/HI or AV hallucinations. PHQ9=9. Today is his first day in [facility] but going well so far. No issues or complaints. No side effects from medication.

## 2020-08-07 NOTE — Progress Notes (Signed)
Pt attended spiritual care group   08/07/2020  11:00-12:00.  Group met via web-ex due to COVID-19 precautions.  Group facilitated by Simone Curia, MDiv, Warden   Group focused on topic of "community."  Members reflected on topic in facilitated dialog, identifying responses to topic and notions they hold of community from their previous experience.  Group members utilized value sort cards to identify top qualities they look for in community.  Engaged in facilitated dialog around their value choices, noting origin of these values, how these are realized in their lives, and strategies for engaging these values.    Spiritual care group drew on Motivational Interviewing, Narrative and Adlerian modalities.  Jesse Schultz was present throughout group.  Affect brightened through group activity and discussion - especially in speaking about his music and a guitar that is special to him.  Jesse Schultz identified values in community - caring, ecology, creativity, openness and reflected on these with other group member.

## 2020-08-07 NOTE — Psych (Signed)
Virtual Visit via Video Note  I connected with Jesse Schultz on 08/07/20 at  9:00 AM EST by a video enabled telemedicine application and verified that I am speaking with the correct person using two identifiers.  Location: Patient: patient home Provider: clinical home office   I discussed the limitations of evaluation and management by telemedicine and the availability of in person appointments. The patient expressed understanding and agreed to proceed.  I discussed the assessment and treatment plan with the patient. The patient was provided an opportunity to ask questions and all were answered. The patient agreed with the plan and demonstrated an understanding of the instructions.   The patient was advised to call back or seek an in-person evaluation if the symptoms worsen or if the condition fails to improve as anticipated.  Cln and pt completed treatment plan. Pt verbally reports alignment. Pt gives verbal consent to treatment.   I provided 10 minutes of non-face-to-face time during this encounter.   Donia Guiles, LCSW

## 2020-08-07 NOTE — Therapy (Signed)
[facility] PARTIAL HOSPITALIZATION PROGRAM [address] [place], [place], [address] Phone: [phone]   Fax:  [phone] Virtual Visit via Video Note  I connected with Jesse Schultz on 08/07/20 at  12:00 PM EST by a video enabled telemedicine application and verified that I am speaking with the correct person using two identifiers.  Location: Patient: Patient Home Provider: Clinic Office   I discussed the limitations of evaluation and management by telemedicine and the availability of in person appointments. The patient expressed understanding and agreed to proceed.   I discussed the assessment and treatment plan with the patient. The patient was provided an opportunity to ask questions and all were answered. The patient agreed with the plan and demonstrated an understanding of the instructions.   The patient was advised to call back or seek an in-person evaluation if the symptoms worsen or if the condition fails to improve as anticipated.  I provided 65 minutes of non-face-to-face time during this encounter. OT Evaluation 15 minutes OT Group Therapy 50 minutes   Donne Hazel, OT   Occupational Therapy Evaluation  Patient Details  Name: Jesse Schultz MRN: [id number] Date of Birth: May 17, 2001 Referring Provider (OT): Hillery Jacks   Encounter Date: 08/07/2020   OT End of Session - 08/07/20 1427    Visit Number 1    Number of Visits 20    Date for OT Re-Evaluation 09/04/20    Authorization Type BCBS    OT Start Time 1200   OT Eval [id number]   OT Stop Time 1250    OT Time Calculation (min) 50 min    Activity Tolerance Patient tolerated treatment well    Behavior During Therapy Flat affect           Past Medical History:  Diagnosis Date  . Allergy   . Anxiety   . Asthma   . Depression   . Fainting spell   . GERD (gastroesophageal reflux disease)     Past Surgical History:  Procedure Laterality Date  . ADENOIDECTOMY AND  MYRINGOTOMY WITH TUBE PLACEMENT    . CIRCUMCISION    . PECTUS BAR INSERTIONPEDIATRIC  2021  . TYMPANOPLASTY    . TYMPANOSTOMY TUBE PLACEMENT    . WISDOM TOOTH EXTRACTION      There were no vitals filed for this visit.   Subjective Assessment - 08/07/20 1425    Currently in Pain? No/denies    Pain Score 0-No pain             [facility] OT Assessment - 08/07/20 0001      Assessment   Medical Diagnosis Major depression    Referring Provider (OT) Hillery Jacks      Precautions   Precautions None      Balance Screen   Has the patient fallen in the past 6 months No    Has the patient had a decrease in activity level because of a fear of falling?  No    Is the patient reluctant to leave their home because of a fear of falling?  No            OT Education - 08/07/20 1425    Education Details Educated on OT within [facility] programming along with physical symptomology of stress and its effects on the body, along with positive stress management tips/strategies    Person(s) Educated Patient    Methods Explanation;Handout    Comprehension Verbalized understanding;Verbal cues required  OT Short Term Goals - 08/07/20 1434      OT SHORT TERM GOAL #1   Title Pt will actively engage in OT group sessions throughout duration of PHP programming, in order to promote daily structure, social engagement, and opportunities to develop and utilize adaptive strategies to maximize functional performance in preparation for safe transition and integration back into school, work, and the community.    Time 4    Period Weeks    Status New    Target Date 09/04/20      OT SHORT TERM GOAL #2   Title Pt will practice and identify 1-3 adaptive coping strategies he can utilize, in order to safely manage increased depression/anxiety, with min cues, in preparation for safe and healthy reintegration back into the community at discharge.    Time 4    Period Weeks    Status New    Target Date 09/04/20       OT SHORT TERM GOAL #3   Title Pt will identify and implement 1-3 sleep hygiene strategies he can utilize, in order to improve sleep quality/ADL performance, in preparation for safe and healthy reintegration back into the community at discharge.    Time 4    Period Weeks    Status New    Target Date 09/04/20      OT SHORT TERM GOAL #4   Title Pt will identify a minimum of two self-soothing relaxation strategies he can utilize, to safely manage increased anxiety and panic attacks, in preparation for safe community reintegration.    Time 4    Period Weeks    Status New    Target Date 09/04/20         Occupational Therapy Assessment 08/07/2020  Jesse Schultz is a 19 y/o male with PMHx of major depression, anxiety, and ADHD. [facility] due to ongoing suicidal ideation and recent suicide attempt. Pt denied current psychosocial stressors, reports having social supports and denies any reason leading to attempt/SI - identifies his SA as 'impulsive.' Pt reports difficulty managing his mental health, however denies any goals or ways in which programming may be helpful. Pt agrees and reports desire to engage in PHP programming in order to engage meaningfully in identified areas of occupation and ADL/iADLs. Upon approach for initial evaluation, pt presents virtually on video/camera, appears fairly groomed, dressed in shirt and pajama pants. Pt flat in affect with short, quick responses. Pt reports enjoying video games, guitar, and skateboarding and identifies goal for admission "Nothing".   Precautions/Limitations: No concerns noted  Cognition: Appears intact   Visual Motor: Pt wears glasses for visual deficits, reports wears them 24/7 except when sleeping.    Living Situation: Pt lives at home with his Mom, Dad, dog, and pet frog. Pt reports having two brothers, however they do not live with patient currently.  School/Work: Pt works part time as a Conservation officer, naturecashier at [employer]. Pt  reports he does not like his job and is unsure if he will return to work [facility]. Pt reports he is currently on a medical leave for the week.   ADL/iADL Performance: Pt denies any difficulties with ADL/iADLs; rates self as a 10/10   Leisure Interests and Hobbies: Enjoys skateboarding, playing guitar, video games, watching TV, and hanging out with his friends and/or girlfriend  Social Support: Pt reports Mom and girlfriend as sources of support. Reports being in relationship with gf for 'a few years.'  What do you do when you are very stressed, angry, upset, sad or anxious? Isolate from others, Talk to someone and Listen to music   What helps when you are not feeling well? Taking a shower or bath, Calling my therapist, Going for a walk, Watching TV, Eating something and Listening to music  What are some things that make it MORE difficult for you when you are already upset? Being touched, Not having choices/input, Noise (in general), Not being able to express my opinion, People staring at me, Being criticized and Boredom/Lack of activities  Is there anything specific that you would like help with while you're in the hospital? "Nope"  What is your goal while you are here?  "I don't know, it's too soon to answer that"  Assessment: Pt demonstrates behavior that inhibits/restricts participation in occupation and would benefit from skilled occupational therapy services to address current difficulties with symptom management, emotion regulation, socialization, stress management, time management, job readiness, financial wellness, health and nutrition, sleep hygiene, ADL/iADL performance and leisure participation, in preparation for reintegration and return to community at discharge.   Plan: Pt will participate in skilled occupational therapy sessions (group and/or individual) in order to promote daily structure, social engagement, and opportunities to develop and utilize  adaptive strategies to maximize functional performance in preparation for safe transition and integration back into school, work, and/or the community at discharge. OT sessions will occur 4-5 x per week for 2-4 weeks.   Donne Hazel, MOT, OTR/L  Group Session:  S: "I usually try to avoid the problem until it comes up again."  O: Group began with a check-in and review of previous OT session focused on sensory modulation and self-soothing with members sharing ways in which they coped over the previous afternoon. Today's discussion focused on the topic of stress management. Group members worked collaboratively to create a Microbiologist identifying physical signs, behavioral signs, emotional/psychological, and cognitive signs of stress. Discussion then focused and encouraged group members to identify positive stress management strategies they could utilize in those moments to manage identified signs.   A: Jesse Schultz was active in his participation of discussion and activity, offering several relevant contributions to group brain storm. 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 Pt identified avoiding the problem and procrastination as strategies he often uses to manage his stress, though recognized these are not helpful strategies in the moment. Pt identified playing his guitar, video games, and talking to someone as alternative strategies he could engage in more of to manage his stress more effectively. Pt appeared receptive to additional strategies provided to him by peer.   P: Continue to attend PHP OT group sessions 5x week for 4 weeks to promote daily structure, social engagement, and  opportunities to develop and utilize adaptive strategies to maximize functional performance in preparation for safe transition and integration back into school, work, and the community. Plan to address topic of communication in next OT group session.    Plan - 08/07/20 1428    Clinical Impression Statement Jesse Schultz is a 19 y/o male with PMHx of major depressive disorder, anxiety, and ADHD who was referred to PHP secondary to suicide  attempt and onset of SI. Pt denied current psychosocial stressors, reports having social supports and denies any reason leading to attempt/SI - identifies his SA as 'impulsive.' Pt reports difficulty managing his mental health, however denies any goals or ways in which programming may be helpful. Pt agrees and reports desire to engage in PHP programming in order to engage meaningfully in identified areas of occupation and ADL/iADLs.    OT Occupational Profile and History Problem Focused Assessment - Including review of records relating to presenting problem    Occupational performance deficits (Please refer to evaluation for details): ADL's;IADL's;Rest and Sleep;Education;Leisure;Social Participation;Work    Games developer / Function / Physical Skills ADL    Cognitive Skills Attention;Emotional;Energy/Drive;Memory;Temperament/Personality;Thought;Understand;Safety Awareness;Problem Solve    Psychosocial Skills Coping Strategies;Environmental  Adaptations;Habits;Interpersonal Interaction;Routines and Behaviors    Rehab Potential Good    Clinical Decision Making Limited treatment options, no task modification necessary    Comorbidities Affecting Occupational Performance: May have comorbidities impacting occupational performance    Modification or Assistance to Complete Evaluation  No modification of tasks or assist necessary to complete eval    OT Frequency 5x / week    OT Duration 4 weeks    OT Treatment/Interventions Self-care/ADL training;Patient/family education;Coping  strategies training;Psychosocial skills training    Consulted and Agree with Plan of Care Patient           Patient will benefit from skilled therapeutic intervention in order to improve the following deficits and impairments:   Body Structure / Function / Physical Skills: ADL Cognitive Skills: Attention, Emotional, Energy/Drive, Memory, Temperament/Personality, Thought, Understand, Safety Awareness, Problem Solve Psychosocial Skills: Coping Strategies, Environmental  Adaptations, Habits, Interpersonal Interaction, Routines and Behaviors   Visit Diagnosis: Difficulty coping  Frontal lobe and executive function deficit  Severe episode of recurrent major depressive disorder, without psychotic features (HCC)    Problem List Patient Active Problem List   Diagnosis Date Noted  . Suicidal ideation   . MDD (major depressive disorder), recurrent severe, without psychosis (HCC)   . Anxiety and depression 07/15/2020  . GERD (gastroesophageal reflux disease) 07/15/2020  . Preventative health care 07/15/2020  . Pectus excavatum 07/15/2020  . Transient alteration of awareness 07/16/2015  . 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

## 2020-08-08 ENCOUNTER — Other Ambulatory Visit: Payer: Self-pay

## 2020-08-08 ENCOUNTER — Other Ambulatory Visit (HOSPITAL_COMMUNITY): Payer: BC Managed Care – PPO | Admitting: Licensed Clinical Social Worker

## 2020-08-08 ENCOUNTER — Other Ambulatory Visit (HOSPITAL_COMMUNITY): Payer: BC Managed Care – PPO | Admitting: Occupational Therapy

## 2020-08-08 ENCOUNTER — Encounter (HOSPITAL_COMMUNITY): Payer: Self-pay

## 2020-08-08 DIAGNOSIS — R41844 Frontal lobe and executive function deficit: Secondary | ICD-10-CM

## 2020-08-08 DIAGNOSIS — F332 Major depressive disorder, recurrent severe without psychotic features: Secondary | ICD-10-CM

## 2020-08-08 DIAGNOSIS — R4589 Other symptoms and signs involving emotional state: Secondary | ICD-10-CM

## 2020-08-08 NOTE — Therapy (Signed)
[facility] PARTIAL HOSPITALIZATION PROGRAM [address] [place], [place], [address] Phone: [phone]   Fax:  [phone] Virtual Visit via Video Note  I connected with Jesse Schultz on 08/08/20 at  11:00 AM EST by a video enabled telemedicine application and verified that I am speaking with the correct person using two identifiers.  Location: Patient: Patient Home Provider: Clinic Office    I discussed the limitations of evaluation and management by telemedicine and the availability of in person appointments. The patient expressed understanding and agreed to proceed.   I discussed the assessment and treatment plan with the patient. The patient was provided an opportunity to ask questions and all were answered. The patient agreed with the plan and demonstrated an understanding of the instructions.   The patient was advised to call back or seek an in-person evaluation if the symptoms worsen or if the condition fails to improve as anticipated.  I provided 60 minutes of non-face-to-face time during this encounter.   Donne Hazel, OT   Occupational Therapy Treatment  Patient Details  Name: Jesse Schultz MRN: [id number] Date of Birth: 05-31-2001 Referring Provider (OT): Hillery Jacks   Encounter Date: 08/08/2020   OT End of Session - 08/08/20 1220    Visit Number 2    Number of Visits 20    Date for OT Re-Evaluation 09/04/20    Authorization Type [employer]BS    OT Start Time 1055    OT Stop Time 1155    OT Time Calculation (min) 60 min    Activity Tolerance Patient tolerated treatment well    Behavior During Therapy [facility] for tasks assessed/performed;Flat affect           Past Medical History:  Diagnosis Date  . Allergy   . Anxiety   . Asthma   . Depression   . Fainting spell   . GERD (gastroesophageal reflux disease)     Past Surgical History:  Procedure Laterality Date  . ADENOIDECTOMY AND MYRINGOTOMY WITH TUBE PLACEMENT    .  CIRCUMCISION    . PECTUS BAR INSERTIONPEDIATRIC  2021  . TYMPANOPLASTY    . TYMPANOSTOMY TUBE PLACEMENT    . WISDOM TOOTH EXTRACTION      There were no vitals filed for this visit.   Subjective Assessment - 08/08/20 1219    Currently in Pain? No/denies           OT Education - 08/08/20 1219    Education Details Educated on identifying worry and utilized circle on control tool to categorize what we do and do not have control over    Person(s) Educated Patient    Methods Explanation;Handout    Comprehension Verbalized understanding            OT Short Term Goals - 08/08/20 1220      OT SHORT TERM GOAL #1   Status On-going      OT SHORT TERM GOAL #2   Status On-going      OT SHORT TERM GOAL #3   Status On-going      OT SHORT TERM GOAL #4   Status On-going         Group Session:  S: "A big worry for me is living up to people's standards and standards of my own. A smaller worry is my frog, she's not eating."  O: Group session encouraged increased participation and engagement through discussion focused on worry and our circle of control. Group reviewed a powerpoint that  discussed healthy vs unhealthy worry with specific examples provided. Discussion also focused on utilizing the circle of control outline to identify what is within our control, what we have influence on, and what is not in our control. Group members shared specific examples and worries and identified what categories they fell in within the circle of control.   A: Jesse Schultz was active in his participation of discussion and activity, appeared more animated and engaged than yesterday's session. Pt shared that he played a musical instrument yesterday, his guitar, as an effective stress management technique. Pt was engaged in today's session and identified a big worry for him is living up to the standards set by other people for himself and a small worry is his frog. He was able to break down his worries using the  circle of control tool and recognized that one thing he can control is whether or not he feeds his frog and when, and he cannot control if his frog does actually eat or not. Pt appeared receptive to further education and examples given, while also offering support to other group member during session.   P: Continue to attend PHP OT group sessions 5x week for 3 weeks to promote daily structure, social engagement, and opportunities to develop and utilize adaptive strategies to maximize functional performance in preparation for safe transition and integration back into school, work, and the community. Plan to address topic of communication in next OT group session.    Plan - 08/08/20 1220    Occupational performance deficits (Please refer to evaluation for details): ADL's;IADL's;Rest and Sleep;Education;Leisure;Social Participation;Work    Games developer / Function / Physical Skills ADL    Cognitive Skills Attention;Emotional;Energy/Drive;Memory;Temperament/Personality;Thought;Understand;Safety Awareness;Problem Solve    Psychosocial Skills Coping Strategies;Environmental  Adaptations;Habits;Interpersonal Interaction;Routines and Behaviors           Patient will benefit from skilled therapeutic intervention in order to improve the following deficits and impairments:   Body Structure / Function / Physical Skills: ADL Cognitive Skills: Attention, Emotional, Energy/Drive, Memory, Temperament/Personality, Thought, Understand, Safety Awareness, Problem Solve Psychosocial Skills: Coping Strategies, Environmental  Adaptations, Habits, Interpersonal Interaction, Routines and Behaviors   Visit Diagnosis: Difficulty coping  Frontal lobe and executive function deficit  Severe episode of recurrent major depressive disorder, without psychotic features [facility])    Problem List Patient Active Problem List   Diagnosis Date Noted  . Suicidal ideation   . MDD (major depressive disorder), recurrent severe,  without psychosis (HCC)   . Anxiety and depression 07/15/2020  . GERD (gastroesophageal reflux disease) 07/15/2020  . Preventative health care 07/15/2020  . Pectus excavatum 07/15/2020  . Transient alteration of awareness 07/16/2015  . 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 [place], [place], [address] Phone: [phone]   Fax:  [phone]  Name: Jesse Schultz MRN: [id number] Date of Birth: December 12, 2000

## 2020-08-09 ENCOUNTER — Other Ambulatory Visit (HOSPITAL_COMMUNITY): Payer: BC Managed Care – PPO | Admitting: Occupational Therapy

## 2020-08-09 ENCOUNTER — Other Ambulatory Visit (HOSPITAL_COMMUNITY): Payer: BC Managed Care – PPO | Admitting: Licensed Clinical Social Worker

## 2020-08-09 ENCOUNTER — Other Ambulatory Visit: Payer: Self-pay

## 2020-08-09 ENCOUNTER — Encounter (HOSPITAL_COMMUNITY): Payer: Self-pay

## 2020-08-09 DIAGNOSIS — R41844 Frontal lobe and executive function deficit: Secondary | ICD-10-CM

## 2020-08-09 DIAGNOSIS — R4589 Other symptoms and signs involving emotional state: Secondary | ICD-10-CM

## 2020-08-09 DIAGNOSIS — F332 Major depressive disorder, recurrent severe without psychotic features: Secondary | ICD-10-CM

## 2020-08-09 NOTE — Therapy (Signed)
[facility] PARTIAL HOSPITALIZATION PROGRAM [address] [place], [place], [address] Phone: [phone]   Fax:  [phone] Virtual Visit via Video Note  I connected with Jesse Schultz on 08/09/20 at  11:00 AM EST by a video enabled telemedicine application and verified that I am speaking with the correct person using two identifiers.  Location: Patient: Patient Home Provider: Clinic Office    I discussed the limitations of evaluation and management by telemedicine and the availability of in person appointments. The patient expressed understanding and agreed to proceed.   I discussed the assessment and treatment plan with the patient. The patient was provided an opportunity to ask questions and all were answered. The patient agreed with the plan and demonstrated an understanding of the instructions.   The patient was advised to call back or seek an in-person evaluation if the symptoms worsen or if the condition fails to improve as anticipated.  I provided 50 minutes of non-face-to-face time during this encounter.   Donne Hazel, OT   Occupational Therapy Treatment  Patient Details  Name: Jesse Schultz MRN: [id number] Date of Birth: 2000/11/14 Referring Provider (OT): Hillery Jacks   Encounter Date: 08/09/2020   OT End of Session - 08/09/20 1204    Visit Number 3    Number of Visits 20    Date for OT Re-Evaluation 09/04/20    Authorization Type BCBS    OT Start Time 1100    OT Stop Time 1150    OT Time Calculation (min) 50 min    Activity Tolerance Patient tolerated treatment well    Behavior During Therapy [facility] for tasks assessed/performed;Flat affect           Past Medical History:  Diagnosis Date  . Allergy   . Anxiety   . Asthma   . Depression   . Fainting spell   . GERD (gastroesophageal reflux disease)     Past Surgical History:  Procedure Laterality Date  . ADENOIDECTOMY AND MYRINGOTOMY WITH TUBE PLACEMENT    .  CIRCUMCISION    . PECTUS BAR INSERTIONPEDIATRIC  2021  . TYMPANOPLASTY    . TYMPANOSTOMY TUBE PLACEMENT    . WISDOM TOOTH EXTRACTION      There were no vitals filed for this visit.   Subjective Assessment - 08/09/20 1204    Currently in Pain? No/denies    Pain Score 0-No pain           OT Education - 08/09/20 1204    Education Details Educated on fight-or-flight response and use of relaxation strategies including deep breathing, guided imagery, and PMR    Person(s) Educated Patient    Methods Explanation;Handout    Comprehension Verbalized understanding            OT Short Term Goals - 08/08/20 1220      OT SHORT TERM GOAL #1   Status On-going      OT SHORT TERM GOAL #2   Status On-going      OT SHORT TERM GOAL #3   Status On-going      OT SHORT TERM GOAL #4   Status On-going         Group Session:  S: "I usually like to write music to help me relax. It helps me to unwind and express myself."  O: Group began with a warm-up activity and group members were encouraged to share and identify ways in which they have practiced relaxation in the past, including any  specific strategies or hobbies. Today's discussion focused on the topic of RELAXATION and patients reviewed and engaged in a variety of relaxation strategies techniques including deep breathing, guided imagery/meditation, and progressive muscle relaxation. After each strategy was reviewed, group members were invited to engage in an active practice of relaxation strategies identified. Review and background on the fight-or-flight response was also provided.   A: Jesse Schultz was active in his participation of discussion, though appeared guarded, flat, and distracted at times. Pt was receptive to min verbal cues from this Clinical research associate and engaged appropriately when prompted. Pt shared that one way he likes to relax currently is through writing music, and shared that it helps him to unwind and express himself. He appeared receptive  to all three strategies reviewed on alternative relaxation strategies and identified "five finger breathing" as one technique that stood out to him that he would like to try in the future. Appeared receptive to further resources and education provided, including use of calming apps and videos.   P: Continue to attend PHP OT group sessions 5x week for 3 weeks to promote daily structure, social engagement, and opportunities to develop and utilize adaptive strategies to maximize functional performance in preparation for safe transition and integration back into school, work, and the community. Plan to address topic of communication in next OT group session.      Plan - 08/09/20 1205    Occupational performance deficits (Please refer to evaluation for details): ADL's;IADL's;Rest and Sleep;Education;Leisure;Social Participation;Work    Games developer / Function / Physical Skills ADL    Cognitive Skills Attention;Emotional;Energy/Drive;Memory;Temperament/Personality;Thought;Understand;Safety Awareness;Problem Solve    Psychosocial Skills Coping Strategies;Environmental  Adaptations;Habits;Interpersonal Interaction;Routines and Behaviors           Patient will benefit from skilled therapeutic intervention in order to improve the following deficits and impairments:   Body Structure / Function / Physical Skills: ADL Cognitive Skills: Attention, Emotional, Energy/Drive, Memory, Temperament/Personality, Thought, Understand, Safety Awareness, Problem Solve Psychosocial Skills: Coping Strategies, Environmental  Adaptations, Habits, Interpersonal Interaction, Routines and Behaviors   Visit Diagnosis: Difficulty coping  Frontal lobe and executive function deficit  Severe episode of recurrent major depressive disorder, without psychotic features [facility])    Problem List Patient Active Problem List   Diagnosis Date Noted  . Suicidal ideation   . MDD (major depressive disorder), recurrent severe,  without psychosis ([facility])   . Anxiety and depression 07/15/2020  . GERD (gastroesophageal reflux disease) 07/15/2020  . Preventative health care 07/15/2020  . Pectus excavatum 07/15/2020  . Transient alteration of awareness 07/16/2015  . Attention deficit hyperactivity disorder, 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    08/09/2020  Donne Hazel, MOT, OTR/L  08/09/2020, 12:06 PM  [facility] PARTIAL HOSPITALIZATION PROGRAM [address] [place], [place], [address] Phone: [phone]   Fax:  [phone]  Name: Jesse Schultz MRN: [id number] Date of Birth: Aug 27, 2001

## 2020-08-12 ENCOUNTER — Other Ambulatory Visit: Payer: Self-pay

## 2020-08-12 ENCOUNTER — Other Ambulatory Visit (HOSPITAL_COMMUNITY): Payer: BC Managed Care – PPO | Admitting: Occupational Therapy

## 2020-08-12 ENCOUNTER — Encounter (HOSPITAL_COMMUNITY): Payer: Self-pay

## 2020-08-12 ENCOUNTER — Other Ambulatory Visit (HOSPITAL_COMMUNITY): Payer: BC Managed Care – PPO | Admitting: Licensed Clinical Social Worker

## 2020-08-12 DIAGNOSIS — F332 Major depressive disorder, recurrent severe without psychotic features: Secondary | ICD-10-CM

## 2020-08-12 DIAGNOSIS — R41844 Frontal lobe and executive function deficit: Secondary | ICD-10-CM

## 2020-08-12 DIAGNOSIS — R4589 Other symptoms and signs involving emotional state: Secondary | ICD-10-CM

## 2020-08-12 NOTE — Therapy (Signed)
[place], [place], [address] Phone: [phone]   Fax:  [phone]  Virtual Visit via Video Note  I connected with [place] on 08/12/20 at  11:00 AM EST by a video enabled telemedicine application and verified that I am speaking with the correct person using two identifiers.  Location: Patient: Patient Home Provider: Clinic Office    I discussed the limitations of evaluation and management by telemedicine and the availability of in person appointments. The patient expressed understanding and agreed to proceed.   I discussed the assessment and treatment plan with the patient. The patient was provided an opportunity to ask questions and all were answered. The patient agreed with the plan and demonstrated an understanding of the instructions.   The patient was advised to call back or seek an in-person evaluation if the symptoms worsen or if the condition fails to improve as anticipated.  I provided 60 minutes of non-face-to-face time during this encounter.   [name], OT  Occupational Therapy Treatment  Patient Details  Name: Jesse Schultz MRN: [id number] Date of Birth: 02/13/2001 Referring Provider (OT): Ricky Ala   Encounter Date: 08/12/2020   OT End of Session - 08/12/20 1212    Visit Number 4    Number of Visits 20    Date for OT Re-Evaluation 09/04/20    Authorization Type BCBS Deduct 200- MET Fam Deduct 400-MET Co Ins 20%   25.00 due toward bill OOP 1000-34.64 met  Fam OOP 2000-303.68 No auth required for visits and no limit based on medical necessity    OT Start Time 1100    OT Stop Time 1200    OT Time Calculation (min) 60 min    Activity Tolerance Patient tolerated treatment well    Behavior During Therapy WFL for tasks assessed/performed;Flat affect           Past Medical History:  Diagnosis Date  . Allergy   . Anxiety   . Asthma   . Depression   .  Fainting spell   . GERD (gastroesophageal reflux disease)     Past Surgical History:  Procedure Laterality Date  . ADENOIDECTOMY AND MYRINGOTOMY WITH TUBE PLACEMENT    . CIRCUMCISION    . PECTUS BAR INSERTIONPEDIATRIC  2021  . TYMPANOPLASTY    . TYMPANOSTOMY TUBE PLACEMENT    . WISDOM TOOTH EXTRACTION      There were no vitals filed for this visit.   Subjective Assessment - 08/12/20 1212    Currently in Pain? No/denies    Pain Score 0-No pain           OT Education - 08/12/20 1212    Education Details Educated on different communication styles and identified strategies/tips to practice being more assertive    Person(s) Educated Patient    Methods Explanation;Handout    Comprehension Verbalized understanding            OT Short Term Goals - 08/08/20 1220      OT SHORT TERM GOAL #1   Status On-going      OT SHORT TERM GOAL #2   Status On-going      OT SHORT TERM GOAL #3   Status On-going      OT SHORT TERM GOAL #4   Status On-going         Group Session:  S: "I want to say that I would be assertive, but I think I fall back on  my word sometimes and get uncomfortable saying no, like at work yesterday, and then I just end up doing what they asked me to do."  O: Group began with a reflection from previous OT session on stress management. Today's group focused on topic of Communication Styles. Group members were educated on the different styles including passive, aggressive, and assertive communication. Members shared and reflected on which style they most often find themselves communicating in and how to transition to a more assertive approach.   A: Jesse Schultz was active in his participation of discussion and activity during today's session. Initially, pt appeared largely as an observer in group, however became more engaged and vocal in discussion as time progressed. Pt related strongly with the passive style of communication and shared that this is especially difficult  to get out when at work and shared an example from yesterday. He said that he was asked to do something outside of his responsibility and said no x 2, however as he was continually pressured/asked, pt stated that he 'gave up' and said yes and just did it out of frustration and lack of confidence. He recognized that he would have liked to be more assertive and stick to his word. Pt appeared receptive to support and strategies offered during session, and acknowledged need to continue practicing his communication skills.   P: Continue to attend PHP OT group sessions 5x week for 2 weeks to promote daily structure, social engagement, and opportunities to develop and utilize adaptive strategies to maximize functional performance in preparation for safe transition and integration back into school, work, and the community. Plan to address topic of assertiveness training in next OT group session.    Plan - 08/12/20 1214    Occupational performance deficits (Please refer to evaluation for details): ADL's;IADL's;Rest and Sleep;Education;Leisure;Social Participation;Work    Marketing executive / Function / Physical Skills ADL    Cognitive Skills Attention;Emotional;Energy/Drive;Memory;Temperament/Personality;Thought;Understand;Safety Awareness;Problem Solve    Psychosocial Skills Coping Strategies;Environmental  Adaptations;Habits;Interpersonal Interaction;Routines and Behaviors           Patient will benefit from skilled therapeutic intervention in order to improve the following deficits and impairments:   Body Structure / Function / Physical Skills: ADL Cognitive Skills: Attention, Emotional, Energy/Drive, Memory, Temperament/Personality, Thought, Understand, Safety Awareness, Problem Solve Psychosocial Skills: Coping Strategies, Environmental  Adaptations, Habits, Interpersonal Interaction, Routines and Behaviors   Visit Diagnosis: Difficulty coping  Frontal lobe and executive function deficit  Severe  episode of recurrent major depressive disorder, without psychotic features [facility])    Problem List Patient Active Problem List   Diagnosis Date Noted  . Suicidal ideation   . MDD (major depressive disorder), recurrent severe, without psychosis ([place])   . Anxiety and depression 07/15/2020  . GERD (gastroesophageal reflux disease) 07/15/2020  . Preventative health care 07/15/2020  . Pectus excavatum 07/15/2020  . Transient alteration of awareness 07/16/2015  . Attention deficit hyperactivity disorder, 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    08/12/2020  [name], MOT, OTR/L  08/12/2020, 12:14 PM  [place], [place], [address] Phone: [phone]   Fax:  [phone]  Name: Jesse Schultz MRN: [id number] Date of Birth: May 30, 2001

## 2020-08-13 ENCOUNTER — Other Ambulatory Visit (HOSPITAL_COMMUNITY): Payer: BC Managed Care – PPO | Admitting: Licensed Clinical Social Worker

## 2020-08-13 ENCOUNTER — Other Ambulatory Visit (HOSPITAL_COMMUNITY): Payer: BC Managed Care – PPO

## 2020-08-13 ENCOUNTER — Other Ambulatory Visit: Payer: Self-pay

## 2020-08-13 DIAGNOSIS — F332 Major depressive disorder, recurrent severe without psychotic features: Secondary | ICD-10-CM

## 2020-08-14 ENCOUNTER — Other Ambulatory Visit (HOSPITAL_COMMUNITY): Payer: BC Managed Care – PPO | Admitting: Occupational Therapy

## 2020-08-14 ENCOUNTER — Other Ambulatory Visit: Payer: Self-pay

## 2020-08-14 ENCOUNTER — Other Ambulatory Visit (HOSPITAL_COMMUNITY): Payer: BC Managed Care – PPO | Admitting: Licensed Clinical Social Worker

## 2020-08-14 ENCOUNTER — Encounter (HOSPITAL_COMMUNITY): Payer: Self-pay

## 2020-08-14 DIAGNOSIS — R41844 Frontal lobe and executive function deficit: Secondary | ICD-10-CM

## 2020-08-14 DIAGNOSIS — R4589 Other symptoms and signs involving emotional state: Secondary | ICD-10-CM

## 2020-08-14 DIAGNOSIS — F332 Major depressive disorder, recurrent severe without psychotic features: Secondary | ICD-10-CM | POA: Diagnosis not present

## 2020-08-14 NOTE — Therapy (Signed)
Jesse Schultz, [place], [address] Phone: [phone]   Fax:  [phone] Virtual Visit via Video Note  I connected with Jesse Schultz on 08/14/20 at  12:00 PM EST by a video enabled telemedicine application and verified that I am speaking with the correct person using two identifiers.  Location: Patient: Patient Home Provider: Clinic Office   I discussed the limitations of evaluation and management by telemedicine and the availability of in person appointments. The patient expressed understanding and agreed to proceed.   I discussed the assessment and treatment plan with the patient. The patient was provided an opportunity to ask questions and all were answered. The patient agreed with the plan and demonstrated an understanding of the instructions.   The patient was advised to call back or seek an in-person evaluation if the symptoms worsen or if the condition fails to improve as anticipated.  I provided 50 minutes of non-face-to-face time during this encounter.   Jesse Schultz, OT   Occupational Therapy Treatment  Patient Details  Name: Jesse Schultz MRN: [id number] Date of Birth: December 30, 2000 Referring Provider (OT): Jesse Schultz   Encounter Date: 08/14/2020   OT End of Session - 08/14/20 1459    Visit Number 5    Number of Visits 20    Date for OT Re-Evaluation 09/04/20    Authorization Type BCBS Deduct 200- MET Fam Deduct 400-MET Co Ins 20%   25.00 due toward bill OOP 1000-34.64 met  Fam OOP [id number] No auth required for visits and no limit based on medical necessity    OT Start Time 1200    OT Stop Time 1250    OT Time Calculation (min) 50 min    Activity Tolerance Patient tolerated treatment well    Behavior During Therapy Jesse Schultz for tasks assessed/performed           Past Medical History:  Diagnosis Date  . Allergy   . Anxiety   . Asthma   . Depression   . Fainting spell    . GERD (gastroesophageal reflux disease)     Past Surgical History:  Procedure Laterality Date  . ADENOIDECTOMY AND MYRINGOTOMY WITH TUBE PLACEMENT    . CIRCUMCISION    . PECTUS BAR INSERTIONPEDIATRIC  2021  . TYMPANOPLASTY    . TYMPANOSTOMY TUBE PLACEMENT    . WISDOM TOOTH EXTRACTION      There were no vitals filed for this visit.   Subjective Assessment - 08/14/20 1457    Currently in Pain? No/denies    Pain Score 0-No pain           OT Education - 08/14/20 1457    Education Details Educated on how to navigate aggressive communication and reviewed strategies/tips to "fight fair"    Person(s) Educated Patient    Methods Explanation;Handout    Comprehension Verbalized understanding            OT Short Term Goals - 08/08/20 1220      OT SHORT TERM GOAL #1   Status On-going      OT SHORT TERM GOAL #2   Status On-going      OT SHORT TERM GOAL #3   Status On-going      OT SHORT TERM GOAL #4   Status On-going         Group Session:  S: "I've heard of I statements before, but I don't know that I really use them in actual conversations."  O: Group began with a reflection from previous OT session on assertiveness skills and training. Group members shared an example of how they practiced being assertive post group and into their evening. Today's group was an expansion on assertive training and communication with a focus on [name]. Group members were provided with a set of 'rules' and tips on how to fight fairly, in order to come off as more assertive and less aggressive. Members identified one rule or tip they felt they struggled most with and were encouraged to focus on improving their communication within identified rule.    A: Jesse Schultz was active in his participation of discussion and appeared attentive in reviewing communication skills and strategies learned from previous session. Pt identified need to improve his aggressive communication and identified need to  focus on healthy communication skills as it related to the fair fighting rules reviewed. He shared that one rule that he needs to improve/work on is taking responsibility for how he is feeling with words and less with action. He shared that he could do this by practicing using "I" statements when expressing his feelings/emotions.   P: Continue to attend PHP OT group sessions 5x week for 2 weeks to promote daily structure, social engagement, and opportunities to develop and utilize adaptive strategies to maximize functional performance in preparation for safe transition and integration back into school, work, and the community. Plan to address topic of health and wellness in next OT group session.    Plan - 08/14/20 1459    Occupational performance deficits (Please refer to evaluation for details): ADL's;IADL's;Rest and Sleep;Education;Leisure;Social Participation;Work    Marketing executive / Function / Physical Skills ADL    Cognitive Skills Attention;Emotional;Energy/Drive;Memory;Temperament/Personality;Thought;Understand;Safety Awareness;Problem Solve    Psychosocial Skills Coping Strategies;Environmental  Adaptations;Habits;Interpersonal Interaction;Routines and Behaviors           Patient will benefit from skilled therapeutic intervention in order to improve the following deficits and impairments:   Body Structure / Function / Physical Skills: ADL Cognitive Skills: Attention, Emotional, Energy/Drive, Memory, Temperament/Personality, Thought, Understand, Safety Awareness, Problem Solve Psychosocial Skills: Coping Strategies, Environmental  Adaptations, Habits, Interpersonal Interaction, Routines and Behaviors   Visit Diagnosis: Difficulty coping  Frontal lobe and executive function deficit  Severe episode of recurrent major depressive disorder, without psychotic features [facility])    Problem List Patient Active Problem List   Diagnosis Date Noted  . Suicidal ideation   . MDD (major  depressive disorder), recurrent severe, without psychosis ([name])   . Anxiety and depression 07/15/2020  . GERD (gastroesophageal reflux disease) 07/15/2020  . Preventative health care 07/15/2020  . Pectus excavatum 07/15/2020  . Transient alteration of awareness 07/16/2015  . Attention deficit hyperactivity disorder, 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    08/14/2020  Jesse Schultz, MOT, OTR/L  08/14/2020, 3:00 PM  [place], [place], [address] Phone: [phone]   Fax:  [phone]  Name: Jesse Schultz MRN: [id number] Date of Birth: April 08, 2001

## 2020-08-14 NOTE — Progress Notes (Signed)
Spoke with patient via Webex video call, used 2 identifiers to correctly identify patient. States that groups are going well. No issues or complaints. No side effects from medication. On scale 1-10 as 10 being worst he rates depression at 3 and anxiety at 4. Denies SI/HI or AV hallucinations. Not expecting to be discharged until next week.

## 2020-08-15 ENCOUNTER — Telehealth (HOSPITAL_COMMUNITY): Payer: Self-pay | Admitting: Licensed Clinical Social Worker

## 2020-08-15 ENCOUNTER — Other Ambulatory Visit (HOSPITAL_COMMUNITY): Payer: BC Managed Care – PPO

## 2020-08-15 ENCOUNTER — Other Ambulatory Visit: Payer: Self-pay

## 2020-08-16 ENCOUNTER — Encounter (HOSPITAL_COMMUNITY): Payer: Self-pay

## 2020-08-16 ENCOUNTER — Other Ambulatory Visit (HOSPITAL_COMMUNITY): Payer: BC Managed Care – PPO | Admitting: Occupational Therapy

## 2020-08-16 ENCOUNTER — Other Ambulatory Visit (HOSPITAL_COMMUNITY): Payer: BC Managed Care – PPO | Admitting: Licensed Clinical Social Worker

## 2020-08-16 ENCOUNTER — Other Ambulatory Visit: Payer: Self-pay

## 2020-08-16 DIAGNOSIS — R41844 Frontal lobe and executive function deficit: Secondary | ICD-10-CM

## 2020-08-16 DIAGNOSIS — F332 Major depressive disorder, recurrent severe without psychotic features: Secondary | ICD-10-CM | POA: Diagnosis not present

## 2020-08-16 DIAGNOSIS — R4589 Other symptoms and signs involving emotional state: Secondary | ICD-10-CM

## 2020-08-16 DIAGNOSIS — R45851 Suicidal ideations: Secondary | ICD-10-CM

## 2020-08-16 NOTE — Therapy (Signed)
[facility] [place], [place], [address] Phone: [phone]   Fax:  [phone] Virtual Visit via Video Note  I connected with Jesse Schultz on 08/16/20 at  11:30 AM EST by a video enabled telemedicine application and verified that I am speaking with the correct person using two identifiers.  Location: Patient: Patient Home Provider: Clinic Office    I discussed the limitations of evaluation and management by telemedicine and the availability of in person appointments. The patient expressed understanding and agreed to proceed.   I discussed the assessment and treatment plan with the patient. The patient was provided an opportunity to ask questions and all were answered. The patient agreed with the plan and demonstrated an understanding of the instructions.   The patient was advised to call back or seek an in-person evaluation if the symptoms worsen or if the condition fails to improve as anticipated.  I provided 60 minutes of non-face-to-face time during this encounter.   [name], OT   Occupational Therapy Treatment  Patient Details  Name: Jesse Schultz MRN: [id number] Date of Birth: [birth year] Referring Provider (OT): Ricky Ala   Encounter Date: 08/16/2020   OT End of Session - 08/16/20 1458    Visit Number 6    Number of Visits 20    Date for OT Re-Evaluation 09/04/20    Authorization Type BCBS Deduct 200- MET Fam Deduct 400-MET Co Ins 20%   25.00 due toward bill OOP 1000-34.64 met  Fam OOP 2000-303.68 No auth required for visits and no limit based on medical necessity    OT Start Time 1130    OT Stop Time 1230    OT Time Calculation (min) 60 min    Activity Tolerance Patient tolerated treatment well    Behavior During Therapy WFL for tasks assessed/performed           Past Medical History:  Diagnosis Date  . Allergy   . Anxiety   . Asthma   . Depression   . Fainting spell    . GERD (gastroesophageal reflux disease)     Past Surgical History:  Procedure Laterality Date  . ADENOIDECTOMY AND MYRINGOTOMY WITH TUBE PLACEMENT    . CIRCUMCISION    . PECTUS BAR INSERTIONPEDIATRIC  2021  . TYMPANOPLASTY    . TYMPANOSTOMY TUBE PLACEMENT    . WISDOM TOOTH EXTRACTION      There were no vitals filed for this visit.   Subjective Assessment - 08/16/20 1458    Currently in Pain? No/denies    Pain Score 0-No pain            OT Education - 08/16/20 1458    Education Details Continued education on different factors that contribute to our ability to manage our time, along with specific time management tips/strategies, including procrastination    Person(s) Educated Patient    Methods Explanation;Handout    Comprehension Verbalized understanding            OT Short Term Goals - 08/08/20 1220      OT SHORT TERM GOAL #1   Status On-going      OT SHORT TERM GOAL #2   Status On-going      OT SHORT TERM GOAL #3   Status On-going      OT SHORT TERM GOAL #4   Status On-going         Group Session:  S: "That was really helpful, thank you."  O: Group began with a recap on strategies/tips learned from previous OT session focused on time management. Today's group continued to focus on topic of time management and reviewed additional strategies to manage and create schedules that are more efficient and effective in meeting their needs. Group members also discussed specific strategies to overcome procrastination.   A: La was active and independent in his participation of discussion, sharing that he feels as though his time management skills are "pretty good" right now, though noted benefit of strategies reviewed and educated on during session. He noted that it would be helpful if he got himself a planning tool like a planner or something to remind himself when he has different appointments and things to do. Appeared receptive to additional strategies reviewed  and suggestions provided by peers.   P: Continue to attend PHP OT group sessions 5x week for 2 weeks to promote daily structure, social engagement, and opportunities to develop and utilize adaptive strategies to maximize functional performance in preparation for safe transition and integration back into school, work, and the community. Plan to address topic of health and wellness in next OT group session.   Plan - 08/16/20 1459    Occupational performance deficits (Please refer to evaluation for details): ADL's;IADL's;Rest and Sleep;Education;Leisure;Social Participation;Work    Marketing executive / Function / Physical Skills ADL    Cognitive Skills Attention;Emotional;Energy/Drive;Memory;Temperament/Personality;Thought;Understand;Safety Awareness;Problem Solve    Psychosocial Skills Coping Strategies;Environmental  Adaptations;Habits;Interpersonal Interaction;Routines and Behaviors           Patient will benefit from skilled therapeutic intervention in order to improve the following deficits and impairments:   Body Structure / Function / Physical Skills: ADL Cognitive Skills: Attention,Emotional,Energy/Drive,Memory,Temperament/Personality,Thought,Understand,Safety Awareness,Problem Solve Psychosocial Skills: Coping Strategies,Environmental  Adaptations,Habits,Interpersonal Interaction,Routines and Behaviors   Visit Diagnosis: Difficulty coping  Frontal lobe and executive function deficit  Severe episode of recurrent major depressive disorder, without psychotic features [facility])    Problem List Patient Active Problem List   Diagnosis Date Noted  . Suicidal ideation   . MDD (major depressive disorder), recurrent severe, without psychosis ([place])   . Anxiety and depression 07/15/2020  . GERD (gastroesophageal reflux disease) 07/15/2020  . Preventative health care 07/15/2020  . Pectus excavatum 07/15/2020  . Transient alteration of awareness 07/16/2015  . Attention deficit hyperactivity  disorder, 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    08/16/2020  [name], MOT, OTR/L 08/16/2020, 2:59 PM  [facility] PARTIAL HOSPITALIZATION PROGRAM [place], [place], [address] Phone: [phone]   Fax:  [phone]  Name: Jesse Schultz MRN: [id number] Date of Birth: 2001/03/16

## 2020-08-16 NOTE — Progress Notes (Signed)
Virtual Visit via Video Note  I connected with Jesse Schultz on 08/16/20 at  9:00 AM EST by a video enabled telemedicine application and verified that I am speaking with the correct person using two identifiers.  Location: Patient: Home Provider: Home   I discussed the limitations of evaluation and management by telemedicine and the availability of in person appointments. The patient expressed understanding and agreed to proceed.   I discussed the assessment and treatment plan with the patient. The patient was provided an opportunity to ask questions and all were answered. The patient agreed with the plan and demonstrated an understanding of the instructions.   The patient was advised to call back or seek an in-person evaluation if the symptoms worsen or if the condition fails to improve as anticipated.  I provided 15 minutes of non-face-to-face time during this encounter.   Jesse Rack, NP   BH MD/PA/NP OP Progress Note  08/16/2020 2:14 PM Jesse Schultz  MRN:  [id number]   Evaluation: Jesse Schultz 19 year old Caucasian male was seen and evaluated via WebEx.  He presents flat, guarded and depressed.  Continues to report low mood and depression.  He denies auditory or visual hallucinations.  States suicidal ideations that are intermittent.  Requesting medication adjustment.  Discussed increasing Abilify 5 mg to 7.5 mg p.o. daily continue Zoloft 100 mg.  Patient reported decreased interaction between he and his girlfriend.  States " and oriented in a better place" denied verbal altercation. Jesse Schultz cites mood irritability, " I just feel blah on most days".  Patient has been attending partial hospitalization programming.  Patient to consider intensive outpatient programming at discharge.  We will follow-up criteria for with ECT therapy for possible referral consideration.  Jesse Schultz reported he is working on " deep breathing" when he starts feeling overwhelmed.  Support, encouragement and  reassurance was provided.  Visit Diagnosis: No diagnosis found.  Past Psychiatric History:   Past Medical History:  Past Medical History:  Diagnosis Date  . Allergy   . Anxiety   . Asthma   . Depression   . Fainting spell   . GERD (gastroesophageal reflux disease)     Past Surgical History:  Procedure Laterality Date  . ADENOIDECTOMY AND MYRINGOTOMY WITH TUBE PLACEMENT    . CIRCUMCISION    . PECTUS BAR INSERTIONPEDIATRIC  2021  . TYMPANOPLASTY    . TYMPANOSTOMY TUBE PLACEMENT    . WISDOM TOOTH EXTRACTION      Family Psychiatric History:   Family History:  Family History  Problem Relation Age of Onset  . Allergies Mother   . Asthma Maternal Grandmother   . Allergies Maternal Grandmother   . Heart disease Maternal Grandfather     Social History:  Social History   Socioeconomic History  . Marital status: Single    Spouse name: Not on file  . Number of children: 0  . Years of education: Not on file  . Highest education level: High school graduate  Occupational History  . Not on file  Tobacco Use  . Smoking status: Never Smoker  . Smokeless tobacco: Never Used  Vaping Use  . Vaping Use: Never used  Substance and Sexual Activity  . Alcohol use: No  . Drug use: No  . Sexual activity: Yes  Other Topics Concern  . Not on file  Social History Narrative   Jesse Schultz is in ninth grade at [employer]. He is meeting most of the goals on his IEP.He struggles with math.  Jesse Schultz lives with his parents, and two brothers.   Social Determinants of Health   Financial Resource Strain: Not on file  Food Insecurity: Not on file  Transportation Needs: Not on file  Physical Activity: Not on file  Stress: Not on file  Social Connections: Not on file    Allergies:  Allergies  Allergen Reactions  . Other Cough    Seasonal Allergies in Spring & Fall  Seasonal Allergies in Spring & Fall    Metabolic Disorder Labs: No results found for: HGBA1C, MPG No results  found for: PROLACTIN No results found for: CHOL, TRIG, HDL, CHOLHDL, VLDL, LDLCALC No results found for: TSH  Therapeutic Level Labs: No results found for: LITHIUM No results found for: VALPROATE No components found for:  CBMZ  Current Medications: Current Outpatient Medications  Medication Sig Dispense Refill  . acetaminophen (TYLENOL) 500 MG tablet Take 500 mg by mouth every 6 (six) hours as needed.    . ARIPiprazole (ABILIFY) 5 MG tablet Take by mouth.    Marland Kitchen ibuprofen (ADVIL,MOTRIN) 200 MG tablet Take 200 mg by mouth every 6 (six) hours as needed.    . loratadine (CLARITIN) 10 MG tablet Take 10 mg by mouth daily.    Marland Kitchen omeprazole (PRILOSEC) 20 MG capsule Take 20 mg by mouth daily.    . sertraline (ZOLOFT) 100 MG tablet Take by mouth.     No current facility-administered medications for this visit.     Musculoskeletal: Strength & Muscle Tone: within normal limits Gait & Station: N/A Patient leans: N/A  Psychiatric Specialty Exam: Review of Systems  There were no vitals taken for this visit.There is no height or weight on file to calculate BMI.  General Appearance: Casual and Guarded  Eye Contact:  Good  Speech:  Clear and Coherent  Volume:  Normal  Mood:  Depressed  Affect:  Depressed and Flat  Thought Process:  Coherent  Orientation:  Full (Time, Place, and Person)  Thought Content: Logical   Suicidal Thoughts:  No reported intermittent suicidal ideations denies plan or intent during this assessment  Homicidal Thoughts:  No  Memory:  Immediate;   Fair Recent;   Fair  Judgement:  Fair  Insight:  Fair  Psychomotor Activity:  Normal  Concentration:  Concentration: Fair  Recall:  [employer] of Knowledge: Fair  Language: Fair  Akathisia:  No  Handed:  Right  AIMS (if indicated):   Assets:  Communication Skills Social Support  ADL's:  Intact  Cognition: WNL  Sleep:  Fair   Screenings: GAD-7   Flowsheet Row Office Visit from 07/15/2020 in [facility] at  [facility]  Total GAD-7 Score 6    PHQ2-9   Flowsheet Row Counselor from 08/07/2020 in [facility] PARTIAL HOSPITALIZATION PROGRAM Office Visit from 07/15/2020 in [facility]  PHQ-2 Total Score 2 1  PHQ-9 Total Score 9 5       Assessment and Plan:  Continue partial hospitalization programming Increase Abilify 5mg  to 7 mg p.o. daily Continue Zoloft 100 mg p.o. daily  Treatment plan was reviewed and agreed upon by NPT Demecia Northway and patient Jesse Schultz's need for continued group services   Enid Derry, NP 08/16/2020, 2:14 PM

## 2020-08-17 ENCOUNTER — Encounter (HOSPITAL_COMMUNITY): Payer: Self-pay | Admitting: Family

## 2020-08-17 MED ORDER — ARIPIPRAZOLE 2 MG PO TABS: 2.0000 mg | ORAL_TABLET | Freq: Every day | ORAL | 0 refills | Status: DC

## 2020-08-19 ENCOUNTER — Other Ambulatory Visit: Payer: Self-pay

## 2020-08-19 ENCOUNTER — Encounter (HOSPITAL_COMMUNITY): Payer: Self-pay

## 2020-08-19 ENCOUNTER — Other Ambulatory Visit (HOSPITAL_COMMUNITY): Payer: BC Managed Care – PPO | Admitting: Occupational Therapy

## 2020-08-19 ENCOUNTER — Other Ambulatory Visit (HOSPITAL_COMMUNITY): Payer: BC Managed Care – PPO | Admitting: Licensed Clinical Social Worker

## 2020-08-19 DIAGNOSIS — R4589 Other symptoms and signs involving emotional state: Secondary | ICD-10-CM

## 2020-08-19 DIAGNOSIS — F332 Major depressive disorder, recurrent severe without psychotic features: Secondary | ICD-10-CM

## 2020-08-19 DIAGNOSIS — R41844 Frontal lobe and executive function deficit: Secondary | ICD-10-CM

## 2020-08-19 NOTE — Therapy (Signed)
Jesse Schultz, [place], [address] Phone: [phone]   Fax:  [phone] Virtual Visit via Video Note  I connected with Jesse Schultz on 08/19/20 at  11:00 AM EST by a video enabled telemedicine application and verified that I am speaking with the correct person using two identifiers.  Location: Patient: Patient Home Provider: Clinic Office   I discussed the limitations of evaluation and management by telemedicine and the availability of in person appointments. The patient expressed understanding and agreed to proceed.   I discussed the assessment and treatment plan with the patient. The patient was provided an opportunity to ask questions and all were answered. The patient agreed with the plan and demonstrated an understanding of the instructions.   The patient was advised to call back or seek an in-person evaluation if the symptoms worsen or if the condition fails to improve as anticipated.  I provided 65 minutes of non-face-to-face time during this encounter.   Jesse Schultz, OT   Occupational Therapy Treatment  Patient Details  Name: Jesse Schultz MRN: [id number] Date of Birth: Aug 28, 2001 Referring Provider (OT): Ricky Ala   Encounter Date: 08/19/2020   OT End of Session - 08/19/20 1240    Visit Number 7    Number of Visits 20    Date for OT Re-Evaluation 09/04/20    Authorization Type BCBS Deduct 200- MET Fam Deduct 400-MET Co Ins 20%   25.00 due toward bill OOP 1000-34.64 met  Fam OOP 2000-303.68 No auth required for visits and no limit based on medical necessity    OT Start Time 1110    OT Stop Time 1215    OT Time Calculation (min) 65 min    Activity Tolerance Patient tolerated treatment well    Behavior During Therapy WFL for tasks assessed/performed           Past Medical History:  Diagnosis Date  . Allergy   . Anxiety   . Asthma   . Depression   . Fainting spell    . GERD (gastroesophageal reflux disease)     Past Surgical History:  Procedure Laterality Date  . ADENOIDECTOMY AND MYRINGOTOMY WITH TUBE PLACEMENT    . CIRCUMCISION    . PECTUS BAR INSERTIONPEDIATRIC  2021  . TYMPANOPLASTY    . TYMPANOSTOMY TUBE PLACEMENT    . WISDOM TOOTH EXTRACTION      There were no vitals filed for this visit.   Subjective Assessment - 08/19/20 1240    Currently in Pain? No/denies    Pain Score 0-No pain             OT Education - 08/19/20 1240    Education Details Educated on concept of sensory modulation and self-soothing as coping strategies through use of the eight senses    Person(s) Educated Patient    Methods Explanation;Handout    Comprehension Verbalized understanding            OT Short Term Goals - 08/08/20 1220      OT SHORT TERM GOAL #1   Status On-going      OT SHORT TERM GOAL #2   Status On-going      OT SHORT TERM GOAL #3   Status On-going      OT SHORT TERM GOAL #4   Status On-going         Group Session:  S: "I used to love jumping on a trampoline, but I don't have  one anymore, I know that made me feel better."  O: Today's group session focused on topic of sensory modulation and self-soothing through use of the 8 senses. Discussion introduced the concept of sensory modulation and integration, focusing on how we can utilize our body and it's senses to self-soothe or cope, when we are experiencing an over or under-whelming sensation or feeling. Group members were introduced to a sensory diet checklist as a helpful tool/resource that can be utilized to identify what activities and strategies we prefer and do not prefer based upon our response to different stimulus. The concept of alerting vs calming activities was also introduced to understand how to counteract how we are feeling (Example: when we are feeling overwhelmed/stressed, engage in something calming. When we are feeling depressed/low energy, engage in something  alerting). Group members engaged actively in discussion sharing their own personal sensory likes/dislikes.    A: Jesse Schultz was active and independent in his participation of discussion and activity, sharing examples of several likes and dislikes as it related to his personal sensory preferences. He identified some activities/self soothing strategies that would be more alerting to him including jumping on a trampoline, smelling/tasting mint, and taking a hot shower. He identified more calming activities/preferences such as listening to the purring of his cat, wrapping in a blanket, and listening to the sound of music or soft rain. Pt identified dislike with weighted blankets, noting they make him feel "more stressed" and "trapped." Appeared attentive and receptive to further education and strategies reviewed throughout session from this writer and other peers.   P: Continue to attend PHP OT group sessions 5x week for 2 weeks to promote daily structure, social engagement, and opportunities to develop and utilize adaptive strategies to maximize functional performance in preparation for safe transition and integration back into school, work, and the community.    Plan - 08/19/20 1241    Occupational performance deficits (Please refer to evaluation for details): ADL's;IADL's;Rest and Sleep;Education;Leisure;Social Participation;Work    Marketing executive / Function / Physical Skills ADL    Cognitive Skills Attention;Emotional;Energy/Drive;Memory;Temperament/Personality;Thought;Understand;Safety Awareness;Problem Solve    Psychosocial Skills Coping Strategies;Environmental  Adaptations;Habits;Interpersonal Interaction;Routines and Behaviors           Patient will benefit from skilled therapeutic intervention in order to improve the following deficits and impairments:   Body Structure / Function / Physical Skills: ADL Cognitive Skills:  Attention,Emotional,Energy/Drive,Memory,Temperament/Personality,Thought,Understand,Safety Awareness,Problem Solve Psychosocial Skills: Coping Strategies,Environmental  Adaptations,Habits,Interpersonal Interaction,Routines and Behaviors   Visit Diagnosis: Difficulty coping  Frontal lobe and executive function deficit  Severe episode of recurrent major depressive disorder, without psychotic features [facility])    Problem List Patient Active Problem List   Diagnosis Date Noted  . Suicidal ideation   . MDD (major depressive disorder), recurrent severe, without psychosis ([place])   . Anxiety and depression 07/15/2020  . GERD (gastroesophageal reflux disease) 07/15/2020  . Preventative health care 07/15/2020  . Pectus excavatum 07/15/2020  . Transient alteration of awareness 07/16/2015  . Attention deficit hyperactivity disorder, 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    08/19/2020  Jesse Schultz, MOT, OTR/L  08/19/2020, 12:41 PM  [place], [place], [address] Phone: [phone]   Fax:  [phone]  Name: Jesse Schultz MRN: [id number] Date of Birth: [birth year]

## 2020-08-19 NOTE — Progress Notes (Signed)
Spoke with patient via Webex video call, used 2 identifiers to correctly identify patient. States that groups are going well. He was told by the NP to double his zoloft last week and he has not felt any different. Feels flat and "blah." Message sent to NP to make her aware. No side effects from medication. Denies SI/HI or AV hallucinations. On scale 1-10 as 10 being worst he rates depression at 3 and anxiety at 2. No other issues or complaints.

## 2020-08-20 ENCOUNTER — Other Ambulatory Visit: Payer: Self-pay

## 2020-08-20 ENCOUNTER — Encounter (HOSPITAL_COMMUNITY): Payer: Self-pay | Admitting: Family

## 2020-08-20 ENCOUNTER — Other Ambulatory Visit (HOSPITAL_COMMUNITY): Payer: BC Managed Care – PPO | Admitting: Licensed Clinical Social Worker

## 2020-08-20 ENCOUNTER — Other Ambulatory Visit (HOSPITAL_COMMUNITY): Payer: BC Managed Care – PPO | Admitting: Occupational Therapy

## 2020-08-20 ENCOUNTER — Encounter (HOSPITAL_COMMUNITY): Payer: Self-pay

## 2020-08-20 DIAGNOSIS — R41844 Frontal lobe and executive function deficit: Secondary | ICD-10-CM

## 2020-08-20 DIAGNOSIS — F332 Major depressive disorder, recurrent severe without psychotic features: Secondary | ICD-10-CM

## 2020-08-20 DIAGNOSIS — R4589 Other symptoms and signs involving emotional state: Secondary | ICD-10-CM

## 2020-08-20 NOTE — Therapy (Signed)
Jesse Schultz, [place], [address] Phone: [phone]   Fax:  [phone] Virtual Visit via Video Note  I connected with Jesse Schultz on 08/20/20 at  11:00 AM EST by a video enabled telemedicine application and verified that I am speaking with the correct person using two identifiers.  Location: Patient: Patient Home Provider: Clinic Office   I discussed the limitations of evaluation and management by telemedicine and the availability of in person appointments. The patient expressed understanding and agreed to proceed.   I discussed the assessment and treatment plan with the patient. The patient was provided an opportunity to ask questions and all were answered. The patient agreed with the plan and demonstrated an understanding of the instructions.   The patient was advised to call back or seek an in-person evaluation if the symptoms worsen or if the condition fails to improve as anticipated.  I provided 65 minutes of non-face-to-face time during this encounter.   Jesse Schultz, OT   Occupational Therapy Treatment  Patient Details  Name: Jesse Schultz MRN: [id number] Date of Birth: 2001/08/08 Referring Provider (OT): Ricky Ala   Encounter Date: 08/20/2020   OT End of Session - 08/20/20 1409    Visit Number 8    Number of Visits 20    Date for OT Re-Evaluation 09/04/20    Authorization Type BCBS Deduct 200- MET Fam Deduct 400-MET Co Ins 20%   25.00 due toward bill OOP [id number] met  Fam OOP [id number] No auth required for visits and no limit based on medical necessity    OT Start Time 1105    OT Stop Time 1210    OT Time Calculation (min) 65 min    Activity Tolerance Patient tolerated treatment well    Behavior During Therapy WFL for tasks assessed/performed           Past Medical History:  Diagnosis Date  . Allergy   . Anxiety   . Asthma   . Depression   . Fainting spell    . GERD (gastroesophageal reflux disease)     Past Surgical History:  Procedure Laterality Date  . ADENOIDECTOMY AND MYRINGOTOMY WITH TUBE PLACEMENT    . CIRCUMCISION    . PECTUS BAR INSERTIONPEDIATRIC  2021  . TYMPANOPLASTY    . TYMPANOSTOMY TUBE PLACEMENT    . WISDOM TOOTH EXTRACTION      There were no vitals filed for this visit.   Subjective Assessment - 08/20/20 1409    Currently in Pain? No/denies    Pain Score 0-No pain           OT Education - 08/20/20 1409    Education Details Educated on tips and strategies to improve overall self-care    Person(s) Educated Patient    Methods Explanation;Handout    Comprehension Verbalized understanding            OT Short Term Goals - 08/08/20 1220      OT SHORT TERM GOAL #1   Status On-going      OT SHORT TERM GOAL #2   Status On-going      OT SHORT TERM GOAL #3   Status On-going      OT SHORT TERM GOAL #4   Status On-going         Group Session:  S: "I think my overall self-care is pretty good, but there are some things I could work on."  O: Today's group session  focused on topic of self-care and group reviewed a self-care assessment that identified a specific category within self-care that they needed the most improvement in, including physical, emotional/psychological, social, spiritual, and professional self-care. Discussion focused on members sharing which areas they need the most work in and brainstormed strategies and tips on improving one's self-care with the thought that what someone identifies as their "best self" changes day-to-day contingent upon mood, emotions, and situations. Members identified both one "big" and one "small" self-care activity they can and would like to engage in the near future.   A: Jesse Schultz was moderately engaged in discussion and group discussion today focused on self-care. He shared that his overall self-care is "pretty good" and did not identify any specific areas of improvement or  need to work on. As group progressed, he became more verbally engaged and was able to relate with another group member and acknowledged that he has difficulty saying no, especially in a professional setting such as work, and this impacts his professional self-care and balancing responsibilities with his personal hobbies/goals. He appeared receptive to additional strategies and tips to improve self-care that were provided by other peers and clinician.  P: Continue to attend PHP OT group sessions 5x week for 2 weeks to promote daily structure, social engagement, and opportunities to develop and utilize adaptive strategies to maximize functional performance in preparation for safe transition and integration back into school, work, and the community. Plan to address topic of health and wellness in next OT group session.   Plan - 08/20/20 1410    Occupational performance deficits (Please refer to evaluation for details): ADL's;IADL's;Rest and Sleep;Education;Leisure;Social Participation;Work    Marketing executive / Function / Physical Skills ADL    Cognitive Skills Attention;Emotional;Energy/Drive;Memory;Temperament/Personality;Thought;Understand;Safety Awareness;Problem Solve    Psychosocial Skills Coping Strategies;Environmental  Adaptations;Habits;Interpersonal Interaction;Routines and Behaviors           Patient will benefit from skilled therapeutic intervention in order to improve the following deficits and impairments:   Body Structure / Function / Physical Skills: ADL Cognitive Skills: Attention,Emotional,Energy/Drive,Memory,Temperament/Personality,Thought,Understand,Safety Awareness,Problem Solve Psychosocial Skills: Coping Strategies,Environmental  Adaptations,Habits,Interpersonal Interaction,Routines and Behaviors   Visit Diagnosis: Difficulty coping  Frontal lobe and executive function deficit  Severe episode of recurrent major depressive disorder, without psychotic features  [facility])    Problem List Patient Active Problem List   Diagnosis Date Noted  . Suicidal ideation   . MDD (major depressive disorder), recurrent severe, without psychosis ([place])   . Anxiety and depression 07/15/2020  . GERD (gastroesophageal reflux disease) 07/15/2020  . Preventative health care 07/15/2020  . Pectus excavatum 07/15/2020  . Transient alteration of awareness 07/16/2015  . Attention deficit hyperactivity disorder, 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    08/20/2020  Jesse Schultz, MOT, OTR/L  08/20/2020, 2:10 PM  [facility] HOSPITALIZATION PROGRAM [place], [place], [address] Phone: [phone]   Fax:  [phone]  Name: JULIANO MCEACHIN MRN: [id number] Date of Birth: [birth year]

## 2020-08-20 NOTE — Psych (Signed)
Virtual Visit via Video Note  I connected with Jesse Schultz on 08/07/20 at  9:00 AM EST by a video enabled telemedicine application and verified that I am speaking with the correct person using two identifiers.  Location: Patient: patient home Provider: clinical home office   I discussed the limitations of evaluation and management by telemedicine and the availability of in person appointments. The patient expressed understanding and agreed to proceed.  I discussed the assessment and treatment plan with the patient. The patient was provided an opportunity to ask questions and all were answered. The patient agreed with the plan and demonstrated an understanding of the instructions.   The patient was advised to call back or seek an in-person evaluation if the symptoms worsen or if the condition fails to improve as anticipated.  Pt was provided 240 minutes of non-face-to-face time during this encounter.   Donia Guiles, LCSW    [facility] PHP THERAPIST PROGRESS NOTE  AVID GUILLETTE [phone]  Session Time: 9:00 - 10:00   Participation Level: Active  Behavioral Response: CasualAlertDepressed  Type of Therapy: Group Therapy  Treatment Goals addressed: Coping  Interventions: CBT, DBT, Supportive and Reframing  Summary: Clinician led check-in regarding current stressors and situation, and review of patient completed daily inventory. Clinician utilized active listening and empathetic response and validated patient emotions. Clinician facilitated processing group on pertinent issues.   Therapist Response: Jesse Schultz is a 19 y.o. male who presents with depression symptoms. Patient arrived within time allowed and reports that he is feeling "just okay." Patient rates hismood at a5 on a scale of 1-10 with 10 being great. Pt reports he "didn't do much" yesterday. Pt reports struggling with sleep and experiencing passive SI. Pt able to process. Pt engaged in discussion.       Session Time: 10:00 - 11:00   Participation Level:Active  Behavioral Response:CasualAlertDepressed  Type of Therapy:GroupTherapy  Treatment Goals addressed: Coping  Interventions:CBT, DBT, Supportive and Reframing  Summary: Cln led discussion on difficult relationships. Cln brought in topics of radical acceptance, healthy boundaries, and trust. Group was given space to process and make connections.   Therapist Response:Pt engaged in discussion and shares struggles from his relationships.     Session Time: 11:00- 12:00  Participation Level:Active  Behavioral Response:CasualAlertDepressed  Type of Therapy:Group therapy  Treatment Goals addressed: Coping  Interventions:Supportive, Reframing  Summary:Spiritual Care group  Therapist Response:Pt engaged in session. See chaplain note      Session Time: 12:00 -1:00  Participation Level:Active  Behavioral Response:CasualAlertDepressed  Type of Therapy:Group therapy  Treatment Goals addressed: Coping  Interventions:Psychosocial skills training, Supportive  Summary:12:00 - 12:50:Occupational Therapy group 12:50 -1:00 Clinician led check-out. Clinician assessed for immediate needs, medication compliance and efficacy, and safety concerns  Therapist Response:12:00 - 12:50:Patient engaged in group. See OT note.   12:50 - 1:00: At check-out, patientrates hismood at a5 on a scale of 1-10 with 10 being great.Pt reports afternoon plans oftaking a nap and talking to a friend. Pt demonstrates some progress as evidenced by participating in first session. Patient denies SI/HI/self-harm at the end of group.   Suicidal/Homicidal: Nowithout intent/plan  Plan: Pt will continue in PHP while working to decrease depression symptoms and suicidal thoughts and increase ability to manage symptoms in a healthy manner.   Diagnosis: MDD (major depressive disorder),  recurrent severe, without psychosis ([facility]) [F33.2]    1. MDD (major depressive disorder), recurrent severe, without psychosis ([facility])   2. Suicidal ideation  Donia Guiles, LCSW 08/20/2020

## 2020-08-20 NOTE — Progress Notes (Signed)
Virtual Visit via Video Note  I connected with Jesse Schultz on 08/20/20 at  9:00 AM EST by a video enabled telemedicine application and verified that I am speaking with the correct person using two identifiers.  Location: Patient: Home Provider: Office    I discussed the limitations of evaluation and management by telemedicine and the availability of in person appointments. The patient expressed understanding and agreed to proceed.    I discussed the assessment and treatment plan with the patient. The patient was provided an opportunity to ask questions and all were answered. The patient agreed with the plan and demonstrated an understanding of the instructions.   The patient was advised to call back or seek an in-person evaluation if the symptoms worsen or if the condition fails to improve as anticipated.  I provided 15 minutes of non-face-to-face time during this encounter.   Jesse Rack, NP   [facility] Partial Hospitalization Outpatient Program Discharge Summary  Jesse Schultz [id number]  Admission date: 08/07/2020 Anticipated discharge date: 08/21/2020  Reason for admission: per admission assessment note: Jesse Schultz is a 19 y.o. Caucasian and male presents with depression and suicidal ideations. Jesse Schultz presented flat, guarded and depressed.  Patient reports he attempted to cut his wrists to end it all.  Reports chronic suicidal ideations without previous attempts.  States things became intense and " I cut my wrist."  Reported strained relationship with tenderness family.  Patient did not elaborate and is very limited throughout this assessment.  Patient was enrolled in partial psychiatric program on 08/07/20.  Chemical Use History: Denied   Progress in Program Toward Treatment Goals: Ongoing, patient attended and participated with daily group session with active and engaged participation.  Patient appeared more reactive during discharge assessment.   Patient reports considering stepping down to intensive outpatient programming.  During partial hospitalization programming patient's medication was adjusted.  Patient was to increase Abilify 5 mg to 10 mg p.o. daily. And Continue Zoloft 100 mg daily. Jesse Schultz is currently denying suicidal or homicidal ideations. Patient was receptive to plan. Support, encouragement and reassurances was provided.    Progress (rationale): -Patient is considering stepping down to intensive outpatient programming (IOP). -NP will make medication refills available. -Keep all follow-up appointments with current therapist/psychiatrist.  Take all medications as prescribed. Keep all follow-up appointments as scheduled.  Do not consume alcohol or use illegal drugs while on prescription medications. Report any adverse effects from your medications to your primary care provider promptly.  In the event of recurrent symptoms or worsening symptoms, call 911, a crisis hotline, or go to the nearest emergency department for evaluation.    Jesse Rack, NP 08/20/2020

## 2020-08-21 ENCOUNTER — Other Ambulatory Visit (HOSPITAL_COMMUNITY): Payer: BC Managed Care – PPO | Admitting: Occupational Therapy

## 2020-08-21 ENCOUNTER — Encounter (HOSPITAL_COMMUNITY): Payer: Self-pay

## 2020-08-21 ENCOUNTER — Other Ambulatory Visit (HOSPITAL_COMMUNITY): Payer: BC Managed Care – PPO | Admitting: Licensed Clinical Social Worker

## 2020-08-21 ENCOUNTER — Other Ambulatory Visit: Payer: Self-pay

## 2020-08-21 DIAGNOSIS — R41844 Frontal lobe and executive function deficit: Secondary | ICD-10-CM

## 2020-08-21 DIAGNOSIS — F332 Major depressive disorder, recurrent severe without psychotic features: Secondary | ICD-10-CM

## 2020-08-21 DIAGNOSIS — R4589 Other symptoms and signs involving emotional state: Secondary | ICD-10-CM

## 2020-08-21 NOTE — Therapy (Signed)
Jesse Schultz, [place], [address] Phone: [phone]   Fax:  [phone] Virtual Visit via Video Note  I connected with Jesse Schultz on 08/21/20 at  11:00 AM EST by a video enabled telemedicine application and verified that I am speaking with the correct person using two identifiers.  Location: Patient: Patient Home Provider: Clinic Office   I discussed the limitations of evaluation and management by telemedicine and the availability of in person appointments. The patient expressed understanding and agreed to proceed.   I discussed the assessment and treatment plan with the patient. The patient was provided an opportunity to ask questions and all were answered. The patient agreed with the plan and demonstrated an understanding of the instructions.   The patient was advised to call back or seek an in-person evaluation if the symptoms worsen or if the condition fails to improve as anticipated.  I provided 50 minutes of non-face-to-face time during this encounter.   Jesse Schultz, OT   Occupational Therapy Treatment  Patient Details  Name: Jesse Schultz MRN: [id number] Date of Birth: [birth year] Referring Provider (OT): Ricky Ala   Encounter Date: 08/21/2020   OT End of Session - 08/21/20 1435    Visit Number 9    Number of Visits 20    Date for OT Re-Evaluation 09/04/20    Authorization Type BCBS Deduct 200- MET Fam Deduct 400-MET Co Ins 20%   25.00 due toward bill OOP [id number] met  Fam OOP [id number] No auth required for visits and no limit based on medical necessity    OT Start Time 1200    OT Stop Time 1250    OT Time Calculation (min) 50 min    Activity Tolerance Patient tolerated treatment well    Behavior During Therapy [facility] for tasks assessed/performed           Past Medical History:  Diagnosis Date  . Allergy   . Anxiety   . Asthma   . Depression   . Fainting spell    . GERD (gastroesophageal reflux disease)     Past Surgical History:  Procedure Laterality Date  . ADENOIDECTOMY AND MYRINGOTOMY WITH TUBE PLACEMENT    . CIRCUMCISION    . PECTUS BAR INSERTIONPEDIATRIC  2021  . TYMPANOPLASTY    . TYMPANOSTOMY TUBE PLACEMENT    . WISDOM TOOTH EXTRACTION      There were no vitals filed for this visit.   Subjective Assessment - 08/21/20 1435    Currently in Pain? No/denies    Pain Score 0-No pain           OT Education - 08/21/20 1435    Education Details Educated on sleep hygiene and strategies to improve overall sleep quality    Person(s) Educated Patient    Methods Explanation;Handout    Comprehension Verbalized understanding            OT Short Term Goals - 08/21/20 1436      OT SHORT TERM GOAL #1   Title Pt will actively engage in OT group sessions throughout duration of PHP programming, in order to promote daily structure, social engagement, and opportunities to develop and utilize adaptive strategies to maximize functional performance in preparation for safe transition and integration back into school, work, and the community.    Status Achieved      OT SHORT TERM GOAL #2   Title Pt will practice and identify 1-3 adaptive coping strategies he  can utilize, in order to safely manage increased depression/anxiety, with min cues, in preparation for safe and healthy reintegration back into the community at discharge.    Status Achieved      OT SHORT TERM GOAL #3   Title Pt will identify and implement 1-3 sleep hygiene strategies he can utilize, in order to improve sleep quality/ADL performance, in preparation for safe and healthy reintegration back into the community at discharge.    Status Achieved      OT SHORT TERM GOAL #4   Title Pt will identify a minimum of two self-soothing relaxation strategies he can utilize, to safely manage increased anxiety and panic attacks, in preparation for safe community reintegration.    Status  Achieved           Group Session:  S: "I don't really think I have any concerns that I can think of right now."  O: Today's group discussion focused on topic of Sleep Hygiene. Patients reflected on the quality of sleep they typically receive and identified areas that need improvement. Group was given background information on sleep and sleep hygiene, including common sleep disorders. Group members also received information on how to improve one's sleep and introduced a sleep diary as a tool that can be utilized to track sleep quality over a length of time. Group session ended with patients identifying one or more strategies they could utilize or implement into their sleep routine in order to improve overall sleep quality.    A: Jesse Schultz was minimally engaged in today's group session and discussion, appeared guarded and withdrawn. Pt expressed no difficulties with his sleep and had limited input into discussion or strategies to improve sleep patterns. Pt denied any concerns or anything that was difficult for him, though remained present with camera on throughout. Pt expressed readiness to discharge today and stated if he had any issues or concerns he would follow up with his individual therapist.   P: Plan to discharge patient today, effective 08/21/20 with plan to follow up with individual therapist and PCP for on-going therapy and treatment.  OCCUPATIONAL THERAPY DISCHARGE SUMMARY  Visits from Start of Care: 9  Current functional level related to goals / functional outcomes: Patient has achieved all of his goals specific to OT within the [facility] program and will discharge today with plan to follow up with individual therapist and provider for ongoing support and treatment.    Remaining deficits: See above    Education / Equipment: See above  Plan: Patient agrees to discharge.  Patient goals were met. Patient is being discharged due to meeting the stated rehab goals.  ?????       Plan -  08/21/20 1436    Occupational performance deficits (Please refer to evaluation for details): ADL's;IADL's;Rest and Sleep;Education;Leisure;Social Participation;Work    Marketing executive / Function / Physical Skills ADL    Cognitive Skills Attention;Emotional;Energy/Drive;Memory;Temperament/Personality;Thought;Understand;Safety Awareness;Problem Solve    Psychosocial Skills Coping Strategies;Environmental  Adaptations;Habits;Interpersonal Interaction;Routines and Behaviors           Patient will benefit from skilled therapeutic intervention in order to improve the following deficits and impairments:   Body Structure / Function / Physical Skills: ADL Cognitive Skills: Attention,Emotional,Energy/Drive,Memory,Temperament/Personality,Thought,Understand,Safety Awareness,Problem Solve Psychosocial Skills: Coping Strategies,Environmental  Adaptations,Habits,Interpersonal Interaction,Routines and Behaviors   Visit Diagnosis: Difficulty coping  Frontal lobe and executive function deficit  Severe episode of recurrent major depressive disorder, without psychotic features [facility])    Problem List Patient Active Problem List   Diagnosis Date Noted  .  Suicidal ideation   . MDD (major depressive disorder), recurrent severe, without psychosis ([place])   . Anxiety and depression 07/15/2020  . GERD (gastroesophageal reflux disease) 07/15/2020  . Preventative health care 07/15/2020  . Pectus excavatum 07/15/2020  . Transient alteration of awareness 07/16/2015  . Attention deficit hyperactivity disorder, 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    08/21/2020  Jesse Schultz, MOT, OTR/L  08/21/2020, 2:36 PM  [facility] [place], [place], [address] Phone: [phone]   Fax:  [phone]  Name: Jesse Schultz MRN: [id number] Date of Birth: 06/23/2001

## 2020-08-21 NOTE — Psych (Signed)
Virtual Visit via Video Note  I connected with Jesse Schultz on 08/08/20 at  9:00 AM EST by a video enabled telemedicine application and verified that I am speaking with the correct person using two identifiers.  Location: Patient: patient home Provider: clinical home office   I discussed the limitations of evaluation and management by telemedicine and the availability of in person appointments. The patient expressed understanding and agreed to proceed.  I discussed the assessment and treatment plan with the patient. The patient was provided an opportunity to ask questions and all were answered. The patient agreed with the plan and demonstrated an understanding of the instructions.   The patient was advised to call back or seek an in-person evaluation if the symptoms worsen or if the condition fails to improve as anticipated.  Pt was provided 240 minutes of non-face-to-face time during this encounter.   Donia Guiles, LCSW    [facility] PHP THERAPIST PROGRESS NOTE  Jesse Schultz [id number]  Session Time: 9:00 - 10:00   Participation Level: Active  Behavioral Response: CasualAlertDepressed  Type of Therapy: Group Therapy  Treatment Goals addressed: Coping  Interventions: CBT, DBT, Supportive and Reframing  Summary: Clinician led check-in regarding current stressors and situation, and review of patient completed daily inventory. Clinician utilized active listening and empathetic response and validated patient emotions. Clinician facilitated processing group on pertinent issues.   Therapist Response: Jesse Schultz is a 19 y.o. male who presents with depression symptoms. Patient arrived within time allowed and reports that he is feeling "okay." Patient rates hismood at a5 on a scale of 1-10 with 10 being great. Pt reports he spent time with his mom and talked to a friend. Pt appears somewhat guarded. Pt able to process. Pt engaged in discussion.      Session Time:  10:00 - 11:00   Participation Level:Active  Behavioral Response:CasualAlertDepressed  Type of Therapy:GroupTherapy  Treatment Goals addressed: Coping  Interventions:CBT, DBT, Supportive and Reframing  Summary: Cln led discussion on support groups and the role they can provide in recovery and ongoing treatment. Group discussed success and barriers they have had with past support groups or they have with considering support groups. Cln provided resources for local support groups.   Therapist Response:Pt engaged in discussion and reports mild interest in engaging in support groups.      Session Time: 11:00- 12:00  Participation Level:Active  Behavioral Response:CasualAlertDepressed  Type of Therapy: Group Therapy, OT  Treatment Goals addressed: Coping  Interventions:Psychosocial skills training, Supportive  Summary:Occupational Therapy group  Therapist Response:Patient engaged in group. See OT note.      Session Time: 12:00 -1:00  Participation Level:Active  Behavioral Response:CasualAlertDepressed  Type of Therapy:Group therapy  Treatment Goals addressed: Coping  Interventions:CBT; Solution focused; Supportive; Reframing  Summary:12:00 - 12:50:Cln introduced topic of stress management and the model of the "4 A's of stress management:" avoid, alter, accept, and adapt. Group members worked through Engineer, structural and discussed barriers to utilizing the 4 A's for stressors.  12:50 -1:00 Clinician led check-out. Clinician assessed for immediate needs, medication compliance and efficacy, and safety concerns  Therapist Response:12:00 - 12:50:Pt engaged in discussion and reports understanding of how to utilize the 4 A's.   12:50 - 1:00: At check-out, patientrates hismood at a6 on a scale of 1-10 with 10 being great.Pt reports afternoon plans ofwriting and talking to a friend. Pt demonstrates some progress as evidenced by  reaching out to support system. Patient denies SI/HI/self-harm at the end of group.  Suicidal/Homicidal: Nowithout intent/plan  Plan: Pt will continue in PHP while working to decrease depression symptoms and suicidal thoughts and increase ability to manage symptoms in a healthy manner.   Diagnosis: Severe episode of recurrent major depressive disorder, without psychotic features (HCC) [F33.2]    1. Severe episode of recurrent major depressive disorder, without psychotic features (HCC)       Donia Guiles, LCSW 08/21/2020

## 2020-08-21 NOTE — Psych (Signed)
Virtual Visit via Video Note  I connected with Jesse Schultz on 08/09/20 at  9:00 AM EST by a video enabled telemedicine application and verified that I am speaking with the correct person using two identifiers.  Location: Patient: patient home Provider: clinical home office   I discussed the limitations of evaluation and management by telemedicine and the availability of in person appointments. The patient expressed understanding and agreed to proceed.  I discussed the assessment and treatment plan with the patient. The patient was provided an opportunity to ask questions and all were answered. The patient agreed with the plan and demonstrated an understanding of the instructions.   The patient was advised to call back or seek an in-person evaluation if the symptoms worsen or if the condition fails to improve as anticipated.  Pt was provided 240 minutes of non-face-to-face time during this encounter.   Donia Guiles, LCSW    [facility] PHP THERAPIST PROGRESS NOTE  Jesse Schultz [id number]  Session Time: 9:00 - 10:00   Participation Level: Active  Behavioral Response: CasualAlertDepressed  Type of Therapy: Group Therapy  Treatment Goals addressed: Coping  Interventions: CBT, DBT, Supportive and Reframing  Summary: Clinician led check-in regarding current stressors and situation, and review of patient completed daily inventory. Clinician utilized active listening and empathetic response and validated patient emotions. Clinician facilitated processing group on pertinent issues.   Therapist Response: Jesse Schultz is a 18 y.o. male who presents with depression symptoms. Patient arrived within time allowed and reports that he is feeling "a little better about things." Patient rates hismood at a6 on a scale of 1-10 with 10 being great. 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 Pt reports struggles with anxiety about returning to  work this weekend. Pt able to process. Pt engaged in discussion.      Session Time: 10:00 - 11:00   Participation Level:Active  Behavioral Response:CasualAlertDepressed  Type of Therapy:GroupTherapy  Treatment Goals addressed: Coping  Interventions:CBT, DBT, Supportive and Reframing  Summary: Cln led discussion on ways to manage our feelings. Cln encouraged pt's to rank their feeling in intensity 1-10 with 10 being most intense. If feeling is ranked 5+, pt's main job is to manage the feeling via coping skill. If the feeling is ranked 4 or below, coping or problem solving can be attempted. Cln brought in CBT thought challenging and DBT distress tolerance skills to aid discussion. Group members discussed how to apply and utilize this information.   Therapist Response:Pt engaged in discussion and reports understanding. Pt reports difficulty with wanting to act when emotionally elevated.     Session Time: 11:00- 12:00  Participation Level:Active  Behavioral Response:CasualAlertDepressed  Type of Therapy: Group Therapy, OT  Treatment Goals addressed: Coping  Interventions:Psychosocial skills training, Supportive  Summary:Occupational Therapy group  Therapist Response:Patient engaged in group. See OT note.      Session Time: 12:00 -1:00  Participation Level:Active  Behavioral Response:CasualAlertDepressed  Type of Therapy:Group therapy  Treatment Goals addressed: Coping  Interventions:CBT; Solution focused; Supportive; Reframing  Summary:12:00 - 12:50:Cln led discussion on decision making and how to apply logic to fears. Group viewed TED talk "Why you should define your fears not your goals" to aid discussion. Group discussed ways to consider how we can address fears and make them manageable.  12:50 -1:00 Clinician led check-out. Clinician assessed for immediate needs, medication compliance and efficacy, and safety  concerns  Therapist Response:12:00 - 12:50:Pt engaged in discussion  and practices decision making model with group. 12:50 - 1:00: At check-out, patientrates hismood at [facility] on a scale of 1-10 with 10 being great.Pt reports afternoon plans ofseeing his girlfriend and talking to a friend. Pt demonstrates some progress as evidenced by increased moments of animation. Patient denies SI/HI/self-harm at the end of group.   Suicidal/Homicidal: Nowithout intent/plan  Plan: Pt will continue in PHP while working to decrease depression symptoms and suicidal thoughts and increase ability to manage symptoms in a healthy manner.   Diagnosis: Severe episode of recurrent major depressive disorder, without psychotic features (HCC) [F33.2]    1. Severe episode of recurrent major depressive disorder, without psychotic features (HCC)       Donia Guiles, LCSW 08/21/2020

## 2020-08-21 NOTE — Progress Notes (Signed)
GROUP NOTE -  Spiritual care group 08/21/2020 11:00 - 12:00 ? Group met via web-ex due to COVID-19 precautions.  Group facilitated by Simone Curia, MDiv, BCC  ? ? Group focused on topic of strength. ?Group members reflected on what thoughts and feelings emerge when they hear this topic. ?They then engaged in facilitated dialog around how strength is present in their lives. This dialog focused on representing what strength had been to them in their lives (images and patterns given) and what they saw as helpful in their life now (what they needed / wanted). ? ? Activity drew on narrative framework   Jesse Schultz was present throughout group.  Engaged in activity and conversation with other group members.   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  Processed this with other group members who reflected on this being a time where his art might be deeper or more connected.  Jesse Schultz expressed openness to this possibility

## 2020-08-22 ENCOUNTER — Ambulatory Visit (HOSPITAL_COMMUNITY): Payer: BC Managed Care – PPO

## 2020-08-22 ENCOUNTER — Other Ambulatory Visit (HOSPITAL_COMMUNITY): Payer: BC Managed Care – PPO

## 2020-08-22 NOTE — Psych (Signed)
Virtual Visit via Video Note  I connected with Jesse Schultz on 08/12/20 at  9:00 AM EST by a video enabled telemedicine application and verified that I am speaking with the correct person using two identifiers.  Location: Patient: patient home Provider: clinical home office   I discussed the limitations of evaluation and management by telemedicine and the availability of in person appointments. The patient expressed understanding and agreed to proceed.  I discussed the assessment and treatment plan with the patient. The patient was provided an opportunity to ask questions and all were answered. The patient agreed with the plan and demonstrated an understanding of the instructions.   The patient was advised to call back or seek an in-person evaluation if the symptoms worsen or if the condition fails to improve as anticipated.  Pt was provided 240 minutes of non-face-to-face time during this encounter.   Donia Guiles, LCSW    [facility] PHP THERAPIST PROGRESS NOTE  Jesse Schultz  Session Time: 9:00 - 10:00   Participation Level: Active  Behavioral Response: CasualAlertDepressed  Type of Therapy: Group Therapy  Treatment Goals addressed: Coping  Interventions: CBT, DBT, Supportive and Reframing  Summary: Clinician led check-in regarding current stressors and situation, and review of patient completed daily inventory. Clinician utilized active listening and empathetic response and validated patient emotions. Clinician facilitated processing group on pertinent issues.   Therapist Response: Jesse Schultz is a 19 y.o. male who presents with depression symptoms. Patient arrived within time allowed and reports that he is feeling "slightly better about things." Patient rates hismood at a6 on a scale of 1-10 with 10 being great. Pt reports he worked all weekend and he has decided to quit and go back to school. Pt states he feels positive about this direction. Pt  continues to struggle with disinterest. Pt able to process. Pt engaged in discussion.      Session Time: 10:00 - 11:00   Participation Level:Active  Behavioral Response:CasualAlertDepressed  Type of Therapy:GroupTherapy  Treatment Goals addressed: Coping  Interventions:CBT, DBT, Supportive and Reframing  Summary: Cln led discussion on healthy aggression substitutes. Cln discussed the benefits to discharging energy and adrenaline when feeling "revved up" in emotion and the importance of balancing that discharge with safety and lack if negative consequences. Group brainstormed different ways to channel agression in a healthy way and shared ways that have worked for them in the past.  Therapist Response:Pt engaged in discussion and identifies 3 options to utilize.     Session Time: 11:00- 12:00  Participation Level:Active  Behavioral Response:CasualAlertDepressed  Type of Therapy: Group Therapy, OT  Treatment Goals addressed: Coping  Interventions:Psychosocial skills training, Supportive  Summary:Occupational Therapy group  Therapist Response:Patient engaged in group. See OT note.      Session Time: 12:00 -1:00  Participation Level:Active  Behavioral Response:CasualAlertDepressed  Type of Therapy:Group therapy  Treatment Goals addressed: Coping  Interventions:CBT; Solution focused; Supportive; Reframing  Summary:12:00 - 12:50:Cln introduced topic of DBT distress tolerance skills. Cln provided context for distress tolerance skills and how to practice them. Cln introduced the ACCEPTS distraction skills and group discussed ways to utilize A-C-C.  12:50 -1:00 Clinician led check-out. Clinician assessed for immediate needs, medication compliance and efficacy, and safety concerns  Therapist Response:12:00 - 12:50:Pt engaged in discussion and states they can write and watch tv to practice the "A" skill. 12:50 - 1:00:  At check-out, patientrates hismood at [name] on a scale of 1-10 with 10 being great.Pt reports afternoon plans ofworking  on school registration. Pt demonstrates some progress as evidenced by increased mood stability. Patient denies SI/HI/self-harm at the end of group.   Suicidal/Homicidal: Nowithout intent/plan  Plan: Pt will continue in PHP while working to decrease depression symptoms and suicidal thoughts and increase ability to manage symptoms in a healthy manner.   Diagnosis: Severe episode of recurrent major depressive disorder, without psychotic features (HCC) [F33.2]    1. Severe episode of recurrent major depressive disorder, without psychotic features (HCC)       Donia Guiles, LCSW 08/22/2020

## 2020-08-23 ENCOUNTER — Ambulatory Visit (HOSPITAL_COMMUNITY): Payer: BC Managed Care – PPO

## 2020-08-23 ENCOUNTER — Other Ambulatory Visit (HOSPITAL_COMMUNITY): Payer: BC Managed Care – PPO

## 2020-08-23 NOTE — Psych (Signed)
Virtual Visit via Video Note  I connected with Jesse Schultz on 08/13/20 at  9:00 AM EST by a video enabled telemedicine application and verified that I am speaking with the correct person using two identifiers.  Location: Patient: patient home Provider: clinical home office   I discussed the limitations of evaluation and management by telemedicine and the availability of in person appointments. The patient expressed understanding and agreed to proceed.  I discussed the assessment and treatment plan with the patient. The patient was provided an opportunity to ask questions and all were answered. The patient agreed with the plan and demonstrated an understanding of the instructions.   The patient was advised to call back or seek an in-person evaluation if the symptoms worsen or if the condition fails to improve as anticipated.  Pt was provided 240 minutes of non-face-to-face time during this encounter.   Donia Guiles, LCSW    [facility] PHP THERAPIST PROGRESS NOTE  Jesse Schultz [id number]  Session Time: 9:00 - 10:00   Participation Level: Active  Behavioral Response: CasualAlertDepressed  Type of Therapy: Group Therapy  Treatment Goals addressed: Coping  Interventions: CBT, DBT, Supportive and Reframing  Summary: Clinician led check-in regarding current stressors and situation, and review of patient completed daily inventory. Clinician utilized active listening and empathetic response and validated patient emotions. Clinician facilitated processing group on pertinent issues.   Therapist Response: Jesse Schultz is a 19 y.o. male who presents with depression symptoms. Patient arrived within time allowed and reports that he is feeling "stressed." Patient rates hismood at a6 on a scale of 1-10 with 10 being great. Pt reports his girlfriend is sick in the hospital and he does not have much information. Pt reports having high anxiety. Pt chooses to leave group at 9:40  stating he wanted to go support his girlfriend. Pt denies SI/HI and states plans to be in group tomorrow.    Suicidal/Homicidal: Nowithout intent/plan  Plan: Pt will continue in PHP while working to decrease depression symptoms and suicidal thoughts and increase ability to manage symptoms in a healthy manner.   Diagnosis: Severe episode of recurrent major depressive disorder, without psychotic features (HCC) [F33.2]    1. Severe episode of recurrent major depressive disorder, without psychotic features (HCC)       Donia Guiles, LCSW 08/23/2020

## 2020-08-27 NOTE — Psych (Signed)
Virtual Visit via Video Note  I connected with Jesse Schultz on 08/14/20 at  9:00 AM EST by a video enabled telemedicine application and verified that I am speaking with the correct person using two identifiers.  Location: Patient: patient home Provider: clinical home office   I discussed the limitations of evaluation and management by telemedicine and the availability of in person appointments. The patient expressed understanding and agreed to proceed.  I discussed the assessment and treatment plan with the patient. The patient was provided an opportunity to ask questions and all were answered. The patient agreed with the plan and demonstrated an understanding of the instructions.   The patient was advised to call back or seek an in-person evaluation if the symptoms worsen or if the condition fails to improve as anticipated.  Pt was provided 240 minutes of non-face-to-face time during this encounter.   Donia Guiles, LCSW    [facility] PHP THERAPIST PROGRESS NOTE  Jesse Schultz [id number]  Session Time: 9:00 - 10:00   Participation Level: Active  Behavioral Response: CasualAlertDepressed  Type of Therapy: Group Therapy  Treatment Goals addressed: Coping  Interventions: CBT, DBT, Supportive and Reframing  Summary: Clinician led check-in regarding current stressors and situation, and review of patient completed daily inventory. Clinician utilized active listening and empathetic response and validated patient emotions. Clinician facilitated processing group on pertinent issues.   Therapist Response: Jesse Schultz is a 19 y.o. male who presents with depression symptoms. Patient arrived within time allowed and reports that he is feeling "okay." Patient rates hismood at a6 on a scale of 1-10 with 10 being great. Pt reports his girlfriend is doing better physically however he remains anxious while she recovers. 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 Pt struggles with mood dependent thinking. Pt able to process. Pt engaged in discussion.      Session Time: 10:00 - 11:00   Participation Level:Active  Behavioral Response:CasualAlertDepressed  Type of Therapy:GroupTherapy  Treatment Goals addressed: Coping  Interventions:CBT, DBT, Supportive and Reframing  Summary: Cln led discussion on future plans. Cln created space for pt's to discuss plans for their future and the way those plans have changed over time and/or been altered by mental health. Cln provided supportive space for processing and brought in CBT thought challenging and DBT distress tolerance skills to inform discussion.   Therapist Response:Pt engaged in discussion and is able to process.      Session Time: 11:00- 12:00  Participation Level:Active  Behavioral Response:CasualAlertDepressed  Type of Therapy:Group therapy  Treatment Goals addressed: Coping  Interventions:Supportive, Reframing  Summary:Spiritual Care group  Therapist Response:Pt engaged in session. See chaplain note      Session Time: 12:00 -1:00  Participation Level:Active  Behavioral Response:CasualAlertDepressed  Type of Therapy:Group therapy  Treatment Goals addressed: Coping  Interventions:Psychosocial skills training, Supportive  Summary:12:00 - 12:50:Occupational Therapy group 12:50 -1:00 Clinician led check-out. Clinician assessed for immediate needs, medication compliance and efficacy, and safety concerns  Therapist Response:12:00 - 12:50:Patient engaged in group. See OT note.   12:50 - 1:00: At check-out, patientrates hismood at a6 on a scale of 1-10 with 10 being great.Pt reports afternoon plans of"taking it easy." Pt demonstrates some progress as evidenced by increased effort to engage in pleasant events. Patient denies SI/HI/self-harm at the end of  group.   Suicidal/Homicidal: Nowithout intent/plan  Plan: Pt will continue in PHP while working to decrease depression symptoms  and suicidal thoughts and increase ability to manage symptoms in a healthy manner.   Diagnosis: Severe episode of recurrent major depressive disorder, without psychotic features (HCC) [F33.2]    1. Severe episode of recurrent major depressive disorder, without psychotic features (HCC)       Donia Guiles, LCSW 08/27/2020

## 2020-08-29 NOTE — Psych (Signed)
Virtual Visit via Video Note  I connected with Jesse Schultz on 08/19/20 at  9:00 AM EST by a video enabled telemedicine application and verified that I am speaking with the correct person using two identifiers.  Location: Patient: patient home Provider: clinical home office   I discussed the limitations of evaluation and management by telemedicine and the availability of in person appointments. The patient expressed understanding and agreed to proceed.  I discussed the assessment and treatment plan with the patient. The patient was provided an opportunity to ask questions and all were answered. The patient agreed with the plan and demonstrated an understanding of the instructions.   The patient was advised to call back or seek an in-person evaluation if the symptoms worsen or if the condition fails to improve as anticipated.  Pt was provided 240 minutes of non-face-to-face time during this encounter.   Donia Guiles, LCSW    [facility] PHP THERAPIST PROGRESS NOTE  LYDON VANSICKLE [id number]  Session Time: 9:00 - 10:00   Participation Level: Active  Behavioral Response: CasualAlertDepressed  Type of Therapy: Group Therapy  Treatment Goals addressed: Coping  Interventions: CBT, DBT, Supportive and Reframing  Summary: Clinician led check-in regarding current stressors and situation, and review of patient completed daily inventory. Clinician utilized active listening and empathetic response and validated patient emotions. Clinician facilitated processing group on pertinent issues.   Therapist Response: Jesse Schultz is a 19 y.o. male who presents with depression symptoms. Patient arrived within time allowed and reports that he is feeling "a little bit better than okay." Patient rates hismood at a6 on a scale of 1-10 with 10 being great. Pt reports he felt "blegh" most of the weekend because his friends were unavailable. Pt states he was able to complete some writing and is  feeling hopeful about the upcoming holiday. Pt able to process. Pt engaged in discussion.      Session Time: 10:00 - 11:00   Participation Level:Active  Behavioral Response:CasualAlertDepressed  Type of Therapy:GroupTherapy  Treatment Goals addressed: Coping  Interventions:CBT, DBT, Supportive and Reframing  Summary: Cln led discussion on impulsivity. Group discussed struggles with impulsivity. Cln highlighted theme of immediacy. Group built insight around the way immediacy interacts with impulsivity. Cln encouraged pt's to consider mantras and grounding statements to remind themselves there is time to think/feel/act.   Therapist Response: Pt engaged in discussion and is able to make connections and gain insight.      Session Time: 11:00- 12:00  Participation Level:Active  Behavioral Response:CasualAlertDepressed  Type of Therapy: Group Therapy, OT  Treatment Goals addressed: Coping  Interventions:Psychosocial skills training, Supportive  Summary:Occupational Therapy group  Therapist Response:Patient engaged in group. See OT note.     Session Time: 12:00 -1:00  Participation Level:Active  Behavioral Response:CasualAlertDepressed  Type of Therapy:Group therapy  Treatment Goals addressed: Coping  Interventions:CBT; Solution focused; Supportive; Reframing  Summary:12:00 - 12:50:Cln introduced topic of CBT cognitive distortions. Cln discussed unhealthy thought patterns and how our thoughts shape our reality and irrational thoughts can alter our perspective.  12:50 -1:00 Clinician led check-out. Clinician assessed for immediate needs, medication compliance and efficacy, and safety concerns  Therapist Response:12:00 - 12:50: Pt enaged in discussion and is able to determine examples of distorted thinking in their own life.  12:50 - 1:00: At check-out, patientrates hismood at Jesse Schultz on a scale of 1-10 with 10 being  great.Pt reports afternoon plans ofanimating. Pt demonstrates some progress as evidenced by increased mood. Patient denies SI/HI/self-harm at the  end of group.   Suicidal/Homicidal: Nowithout intent/plan  Plan: Pt will continue in PHP while working to decrease depression symptoms and suicidal thoughts and increase ability to manage symptoms in a healthy manner.   Diagnosis: Severe episode of recurrent major depressive disorder, without psychotic features (HCC) [F33.2]    1. Severe episode of recurrent major depressive disorder, without psychotic features (HCC)       Donia Guiles, LCSW 08/29/2020

## 2020-08-29 NOTE — Psych (Signed)
Virtual Visit via Video Note  I connected with Jesse Schultz on 08/16/20 at  9:00 AM EST by a video enabled telemedicine application and verified that I am speaking with the correct person using two identifiers.  Location: Patient: patient home Provider: clinical home office   I discussed the limitations of evaluation and management by telemedicine and the availability of in person appointments. The patient expressed understanding and agreed to proceed.  I discussed the assessment and treatment plan with the patient. The patient was provided an opportunity to ask questions and all were answered. The patient agreed with the plan and demonstrated an understanding of the instructions.   The patient was advised to call back or seek an in-person evaluation if the symptoms worsen or if the condition fails to improve as anticipated.  Pt was provided 240 minutes of non-face-to-face time during this encounter.   Donia Guiles, LCSW    [facility] PHP THERAPIST PROGRESS NOTE  Jesse Schultz [id number]  Session Time: 9:00 - 10:00   Participation Level: Active  Behavioral Response: CasualAlertDepressed  Type of Therapy: Group Therapy  Treatment Goals addressed: Coping  Interventions: CBT, DBT, Supportive and Reframing  Summary: Clinician led check-in regarding current stressors and situation, and review of patient completed daily inventory. Clinician utilized active listening and empathetic response and validated patient emotions. Clinician facilitated processing group on pertinent issues.   Therapist Response: Jesse Schultz is a 19 y.o. male who presents with depression symptoms. Patient arrived within time allowed and reports that he is feeling "fine." Patient rates hismood at a6 on a scale of 1-10 with 10 being great. Pt reports he was "bummed about missing group due to oversleeping yesterday. Pt states he tried to engage with creativity and watched tv. Pt identifies having SI  yesterday and managing the ideation by talking to his mom and girlfriend. Pt denies current SI. Pt able to process. Pt engaged in discussion.      Session Time: 10:00 - 11:00   Participation Level:Active  Behavioral Response:CasualAlertDepressed  Type of Therapy:GroupTherapy  Treatment Goals addressed: Coping  Interventions:CBT, DBT, Supportive and Reframing  Summary: Cln led group discussion on SI/self harm thoughts. Group members shared triggers for sef-harm/suicidal throughts and how they manage them. Cln encouraged focus to be on management of the thoughts and offered DBT distress tolerance skills as a beneficial way to deal.   Therapist Response:Pt engaged in discussion and is able to process.       Session Time: 11:00- 12:00  Participation Level:Active  Behavioral Response:CasualAlertDepressed  Type of Therapy: Group Therapy, OT  Treatment Goals addressed: Coping  Interventions:Psychosocial skills training, Supportive  Summary:Occupational Therapy group  Therapist Response:Patient engaged in group. See OT note.     Session Time: 12:00 -1:00  Participation Level:Active  Behavioral Response:CasualAlertDepressed  Type of Therapy:Group therapy  Treatment Goals addressed: Coping  Interventions:CBT; Solution focused; Supportive; Reframing  Summary:12:00 - 12:50:Cln led discussion on ways to manage stressors and feelings over the weekend. Group members  brainstormed things to do over the weekend for multiple levels of energy, access, and moods. Cln reviewed crisis services should they be needed and provided pt's with the text crisis line, mobile crisis, national suicide hotline, [facility] 24/7 line, and information on [facility].  12:50 -1:00 Clinician led check-out. Clinician assessed for immediate needs, medication compliance and efficacy, and safety concerns  Therapist Response:12:00 - 12:50:  Pt engaged in  discussion and is able to identify 3 ideas of what to do  over the weekend to keep their mind engaged.  12:50 - 1:00: At check-out, patientrates hismood at a6 on a scale of 1-10 with 10 being great.Pt reports afternoon plans ofwriting and seeing his partner. Pt demonstrates some progress as evidenced by managing SI healthily yesterday. Patient denies SI/HI/self-harm at the end of group.   Suicidal/Homicidal: Nowithout intent/plan  Plan: Pt will continue in PHP while working to decrease depression symptoms and suicidal thoughts and increase ability to manage symptoms in a healthy manner.   Diagnosis: MDD (major depressive disorder), recurrent severe, without psychosis (HCC) [F33.2]    1. MDD (major depressive disorder), recurrent severe, without psychosis (HCC)   2. Suicidal ideation       Donia Guiles, [place] 08/29/2020

## 2020-09-17 NOTE — Psych (Signed)
Virtual Visit via Video Note  I connected with Jesse Schultz on 08/21/20 at  9:00 AM EST by a video enabled telemedicine application and verified that I am speaking with the correct person using two identifiers.  Location: Patient: patient home Provider: clinical home office   I discussed the limitations of evaluation and management by telemedicine and the availability of in person appointments. The patient expressed understanding and agreed to proceed.  I discussed the assessment and treatment plan with the patient. The patient was provided an opportunity to ask questions and all were answered. The patient agreed with the plan and demonstrated an understanding of the instructions.   The patient was advised to call back or seek an in-person evaluation if the symptoms worsen or if the condition fails to improve as anticipated.  Pt was provided 240 minutes of non-face-to-face time during this encounter.   Donia Guiles, LCSW    [facility] PHP THERAPIST PROGRESS NOTE  Jesse Schultz [id number]  Session Time: 9:00 - 10:00   Participation Level: Active  Behavioral Response: CasualAlertDepressed  Type of Therapy: Group Therapy  Treatment Goals addressed: Coping  Interventions: CBT, DBT, Supportive and Reframing  Summary: Clinician led check-in regarding current stressors and situation, and review of patient completed daily inventory. Clinician utilized active listening and empathetic response and validated patient emotions. Clinician facilitated processing group on pertinent issues.   Therapist Response: Jesse Schultz is a 20 y.o. male who presents with depression symptoms. Patient arrived within time allowed and reports that he is feeling "tired." Patient rates hismood at a6 on a scale of 1-10 with 10 being great. Pt reports he did not "do much" yesterday and slept a lot. Pt reports struggling with friends differing availability. Pt able to process. Pt engaged in discussion.       Session Time: 10:00 - 11:00   Participation Level:Active  Behavioral Response:CasualAlertDepressed  Type of Therapy:GroupTherapy  Treatment Goals addressed: Coping  Interventions:CBT, DBT, Supportive and Reframing  Summary: Cln led discussion on personal standards and they way in which it impacts the way we view ourselves and our abilties. Group members discussed judgment, struggles, and barriers they experience in terms of personal standards. Cln brought in topics of balance, grace, and kindness. Cln proposed the "best friend test" as a way to calibrate whether we are vieweing our situation with kindness or harshness.   Therapist Response:Pt engaged in discussion and reports willingness to utilize the best friend test.       Session Time: 11:00- 12:00  Participation Level:Active  Behavioral Response:CasualAlertDepressed  Type of Therapy:Group therapy  Treatment Goals addressed: Coping  Interventions:Supportive, Reframing  Summary:Spiritual Care group  Therapist Response:Pt engaged in session. See chaplain note      Session Time: 12:00 -1:00  Participation Level:Active  Behavioral Response:CasualAlertDepressed  Type of Therapy:Group therapy  Treatment Goals addressed: Coping  Interventions:Psychosocial skills training, Supportive  Summary:12:00 - 12:50:Occupational Therapy group 12:50 -1:00 Clinician led check-out. Clinician assessed for immediate needs, medication compliance and efficacy, and safety concerns  Therapist Response:12:00 - 12:50:Patient engaged in group. See OT note.   12:50 - 1:00: At check-out, patientrates hismood at a6 on a scale of 1-10 with 10 being great.Pt reports afternoon plans of spending time with his mom. Pt demonstrates some progress as evidenced by increased mood stability. Patient denies SI/HI/self-harm at the end of group.   Suicidal/Homicidal: Nowithout  intent/plan  Plan: Pt will discharge from PHP due to meeting treatment goals of decreased depression symptoms and suicidal  thoughts and increased ability to manage symptoms in a healthy manner. Pt has declined IOP and will step down to outpatient services. Pt has followup appt with his therapist Claybon Jabs 12/16 and with his PCP, Dr Rana Snare, for medication management in January 2022. Pt and provider are aligned with discharge. Pt denies SI/HI at time of discharge.   Diagnosis: Severe episode of recurrent major depressive disorder, without psychotic features (HCC) [F33.2]    1. Severe episode of recurrent major depressive disorder, without psychotic features (HCC)       Donia Guiles, LCSW 09/17/2020

## 2020-09-17 NOTE — Psych (Signed)
Virtual Visit via Video Note  I connected with Jesse Schultz on 08/20/20 at  9:00 AM EST by a video enabled telemedicine application and verified that I am speaking with the correct person using two identifiers.  Location: Patient: patient home Provider: clinical home office   I discussed the limitations of evaluation and management by telemedicine and the availability of in person appointments. The patient expressed understanding and agreed to proceed.  I discussed the assessment and treatment plan with the patient. The patient was provided an opportunity to ask questions and all were answered. The patient agreed with the plan and demonstrated an understanding of the instructions.   The patient was advised to call back or seek an in-person evaluation if the symptoms worsen or if the condition fails to improve as anticipated.  Pt was provided 240 minutes of non-face-to-face time during this encounter.   Donia Guiles, LCSW    [facility] PHP THERAPIST PROGRESS NOTE  Jesse Schultz [id number]  Session Time: 9:00 - 10:00   Participation Level: Active  Behavioral Response: CasualAlertDepressed  Type of Therapy: Group Therapy  Treatment Goals addressed: Coping  Interventions: CBT, DBT, Supportive and Reframing  Summary: Clinician led check-in regarding current stressors and situation, and review of patient completed daily inventory. Clinician utilized active listening and empathetic response and validated patient emotions. Clinician facilitated processing group on pertinent issues.   Therapist Response: Jesse Schultz is a 20 y.o. male who presents with depression symptoms. Patient arrived within time allowed and reports that he is feeling "impatient." Patient rates hismood at a6 on a scale of 1-10 with 10 being great. Pt reports he did not get everything done he had hoped and is feeling "undone." Pt states he feels his creative energy coming back however struggled with up  and down moods. Pt able to process. Pt engaged in discussion.      Session Time: 10:00 - 11:00   Participation Level:Active  Behavioral Response:CasualAlertDepressed  Type of Therapy:GroupTherapy  Treatment Goals addressed: Coping  Interventions:CBT, DBT, Supportive and Reframing  Summary: Cln led discussion on unrealistic expectations and the ways expectations can alter perspective. Group members discussed feelings and situations that have occurred due to expectation and how to know if they are unrealistic. Cln brought in CBT thought challenging and reframing to aid discussion.   Therapist Response:Pt engaged in discussion and reports gaining insight.      Session Time: 11:00- 12:00  Participation Level:Active  Behavioral Response:CasualAlertDepressed  Type of Therapy: Group Therapy, OT  Treatment Goals addressed: Coping  Interventions:Psychosocial skills training, Supportive  Summary:Occupational Therapy group  Therapist Response:Patient engaged in group. See OT note.     Session Time: 12:00 -1:00  Participation Level:Active  Behavioral Response:CasualAlertDepressed  Type of Therapy:Group therapy  Treatment Goals addressed: Coping  Interventions:CBT; Solution focused; Supportive; Reframing  Summary:12:00 - 12:50:Cln continued topic of CBT cognitive distortions. Cln utilized handout "cognitive distortions" to discuss common examples of distorted thoughts and group members worked to identify examples in their own life.  12:50 -1:00 Clinician led check-out. Clinician assessed for immediate needs, medication compliance and efficacy, and safety concerns  Therapist Response:12:00 - 12:50: Pt enaged in discussion and reports personalization as problematic for him. 12:50 - 1:00: At check-out, patientrates hismood at a6 on a scale of 1-10 with 10 being great.Pt reports afternoon plans of writing. Pt  demonstrates some progress as evidenced by increased animation. Patient denies SI/HI/self-harm at the end of group.   Suicidal/Homicidal: Nowithout intent/plan  Plan: Pt  will continue in PHP while working to decrease depression symptoms and suicidal thoughts and increase ability to manage symptoms in a healthy manner.   Diagnosis: MDD (major depressive disorder), recurrent severe, without psychosis (HCC) [F33.2]    1. MDD (major depressive disorder), recurrent severe, without psychosis (HCC)       Donia Guiles, LCSW 09/17/2020

## 2020-10-14 ENCOUNTER — Emergency Department (HOSPITAL_COMMUNITY)
Admission: EM | Admit: 2020-10-14 | Discharge: 2020-10-14 | Disposition: A | Discharge status: Home / Self Care | Payer: BC Managed Care – PPO | Source: Home / Self Care | Attending: Emergency Medicine | Admitting: Emergency Medicine

## 2020-10-14 ENCOUNTER — Encounter (HOSPITAL_COMMUNITY): Payer: Self-pay | Admitting: Emergency Medicine

## 2020-10-14 ENCOUNTER — Ambulatory Visit (INDEPENDENT_AMBULATORY_CARE_PROVIDER_SITE_OTHER)
Admission: EM | Admit: 2020-10-14 | Discharge: 2020-10-15 | Disposition: A | Discharge status: Other Acute Inpatient Hospital | Payer: BC Managed Care – PPO | Source: Home / Self Care | Attending: Psychiatry | Admitting: Psychiatry

## 2020-10-14 ENCOUNTER — Other Ambulatory Visit: Payer: Self-pay

## 2020-10-14 DIAGNOSIS — R45851 Suicidal ideations: Secondary | ICD-10-CM

## 2020-10-14 DIAGNOSIS — F419 Anxiety disorder, unspecified: Secondary | ICD-10-CM | POA: Insufficient documentation

## 2020-10-14 DIAGNOSIS — G4700 Insomnia, unspecified: Secondary | ICD-10-CM | POA: Insufficient documentation

## 2020-10-14 DIAGNOSIS — F329 Major depressive disorder, single episode, unspecified: Secondary | ICD-10-CM | POA: Insufficient documentation

## 2020-10-14 DIAGNOSIS — F1290 Cannabis use, unspecified, uncomplicated: Secondary | ICD-10-CM | POA: Insufficient documentation

## 2020-10-14 DIAGNOSIS — Z79899 Other long term (current) drug therapy: Secondary | ICD-10-CM | POA: Insufficient documentation

## 2020-10-14 DIAGNOSIS — F332 Major depressive disorder, recurrent severe without psychotic features: Secondary | ICD-10-CM | POA: Insufficient documentation

## 2020-10-14 DIAGNOSIS — Z20822 Contact with and (suspected) exposure to covid-19: Secondary | ICD-10-CM | POA: Insufficient documentation

## 2020-10-14 DIAGNOSIS — R450 Nervousness: Secondary | ICD-10-CM | POA: Insufficient documentation

## 2020-10-14 LAB — CBC WITH DIFFERENTIAL/PLATELET
Abs Immature Granulocytes: 0.03 10*3/uL (ref 0.00–0.07)
Basophils Absolute: 0 10*3/uL (ref 0.0–0.1)
Basophils Relative: 0 %
Eosinophils Absolute: 0.1 10*3/uL (ref 0.0–0.5)
Eosinophils Relative: 1 %
HCT: 43.1 % (ref 39.0–52.0)
Hemoglobin: 14.5 g/dL (ref 13.0–17.0)
Immature Granulocytes: 0 %
Lymphocytes Relative: 28 %
Lymphs Abs: 2.6 10*3/uL (ref 0.7–4.0)
MCH: 30.8 pg (ref 26.0–34.0)
MCHC: 33.6 g/dL (ref 30.0–36.0)
MCV: 91.5 fL (ref 80.0–100.0)
Monocytes Absolute: 0.4 10*3/uL (ref 0.1–1.0)
Monocytes Relative: 4 %
Neutro Abs: 6 10*3/uL (ref 1.7–7.7)
Neutrophils Relative %: 67 %
Platelets: 204 10*3/uL (ref 150–400)
RBC: 4.71 MIL/uL (ref 4.22–5.81)
RDW: 11.8 % (ref 11.5–15.5)
WBC: 9.2 10*3/uL (ref 4.0–10.5)
nRBC: 0 % (ref 0.0–0.2)

## 2020-10-14 LAB — POCT URINE DRUG SCREEN - MANUAL ENTRY (I-SCREEN)
POC Amphetamine UR: NOT DETECTED
POC Buprenorphine (BUP): NOT DETECTED
POC Cocaine UR: NOT DETECTED
POC Marijuana UR: POSITIVE — AB
POC Methadone UR: NOT DETECTED
POC Methamphetamine UR: NOT DETECTED
POC Morphine: NOT DETECTED
POC Oxazepam (BZO): NOT DETECTED
POC Oxycodone UR: NOT DETECTED
POC Secobarbital (BAR): NOT DETECTED

## 2020-10-14 LAB — RESP PANEL BY RT-PCR (FLU A&B, COVID) ARPGX2
Influenza A by PCR: NEGATIVE
Influenza B by PCR: NEGATIVE
SARS Coronavirus 2 by RT PCR: NEGATIVE

## 2020-10-14 LAB — HEMOGLOBIN A1C
Hgb A1c MFr Bld: 5 % (ref 4.8–5.6)
Mean Plasma Glucose: 96.8 mg/dL

## 2020-10-14 LAB — POC SARS CORONAVIRUS 2 AG -  ED: SARS Coronavirus 2 Ag: NEGATIVE

## 2020-10-14 LAB — POC SARS CORONAVIRUS 2 AG: SARS Coronavirus 2 Ag: NEGATIVE

## 2020-10-14 MED ORDER — ALUM & MAG HYDROXIDE-SIMETH 200-200-20 MG/5ML PO SUSP: 30.0000 mL | ORAL | Status: DC | PRN

## 2020-10-14 MED ORDER — SERTRALINE HCL 50 MG PO TABS
150.0000 mg | ORAL_TABLET | Freq: Every day | ORAL | Status: DC
  Administered 2020-10-15: 150 mg via ORAL
  Filled 2020-10-14: qty 1

## 2020-10-14 MED ORDER — HYDROXYZINE HCL 25 MG PO TABS: 25.0000 mg | ORAL_TABLET | Freq: Three times a day (TID) | ORAL | Status: DC | PRN

## 2020-10-14 MED ORDER — ACETAMINOPHEN 325 MG PO TABS: 650.0000 mg | ORAL_TABLET | Freq: Four times a day (QID) | ORAL | Status: DC | PRN

## 2020-10-14 MED ORDER — MAGNESIUM HYDROXIDE 400 MG/5ML PO SUSP: 30.0000 mL | Freq: Every day | ORAL | Status: DC | PRN

## 2020-10-14 MED ORDER — TRAZODONE HCL 50 MG PO TABS: 50.0000 mg | ORAL_TABLET | Freq: Every evening | ORAL | Status: DC | PRN

## 2020-10-14 NOTE — BH Assessment (Addendum)
Comprehensive Clinical Assessment (CCA) Note  10/14/2020 EARLEY GROBE [id number] -Patient came to [facility] via private vehicle with mother.  Patient had been seen by Dr. Lockie Mola at [facility] earlier in the afternoon.  Patient's psychiatrist (Dr. Jannifer Franklin) had recommended having an assessment today after patient told him today he was having SI w/ a plan.  Mother is present during assessment with permission from patient.  Patient plans to stab himself in the neck with a pen.  He says now "I have different plans."  Patient in November '21 had tried to shoot himself.  Patient has had previous multiple attempts.  Patient denies any HI or A/V hallucinations.  He says he has used marijuana in the past.  Seldom uses it, less than once in a month.    Patient has a flat affect.  He has poor eye contact, is oriented x4.  Pt does not appear to be responding to internal stimuli.  He does not evidence any delusional thought process.  He appears to be logical and coherent.  Pt does not talk much, prefers to speak in low tone and with little elaboration.  Pt reports sleeping through the night.  He reports appetite is normal.  Patient was at [facility] in November of 2021.  He was referred to outpatient IOP and completed that program.  He is seen for psychiatry by Dr. Hardie Lora and for therapy by a Lynetta Mare.  Pt has not had any previous inpatient psychiatric care.     -Pt was seen by Nira Conn, FNP for his MSE.  He recommends inpatient psychiatric care for patient.  Patient is voluntary.  Clinician contacted [facility] Fransico Michael to review patient for possible placement.  Chief Complaint:  Chief Complaint  Patient presents with  . Suicidal  . Depression   Visit Diagnosis: MDD recurrent, severe; ADHD   CCA Screening, Triage and Referral (STR)  Patient Reported Information How did you hear about [place]? Other (Comment)  Referral name: Dr. Jannifer Franklin  Referral phone number: No data recorded  Whom do you see for  routine medical problems? Primary Care  Practice/Facility Name: [place] [facility]  Practice/Facility Phone Number: No data recorded Name of Contact: Dr. Vernona Rieger  Contact Number: No data recorded Contact Fax Number: No data recorded Prescriber Name: No data recorded Prescriber Address (if known): No data recorded  What Is the Reason for Your Visit/Call Today? Patient was sent to [facility] due to SI /w plan to stab himself in the neck with a pen.  Was seen by EDP Dr. Celso Sickle at [facility] and sent to [facility].  How Long Has This Been Causing You Problems? > than 6 months  What Do You Feel Would Help You the Most Today? Other (Comment) (May need inpatient care.)   Have You Recently Been in Any Inpatient Treatment (Hospital/Detox/Crisis Center/28-Day Program)? No  Name/Location of Program/Hospital:No data recorded How Long Were You There? No data recorded When Were You Discharged? No data recorded  Have You Ever Received Services From [facility] Before? Yes  Who Do You See at [facility]? Was seen in [facility] for similar circumstances on 07/28/20.   Have You Recently Had Any Thoughts About Hurting Yourself? Yes (Today)  Are You Planning to Commit Suicide/Harm Yourself At This time? Yes (Plan to stab himself.)   Have you Recently Had Thoughts About Hurting Someone Karolee Ohs? No  Explanation: No data recorded  Have You Used Any Alcohol or Drugs in the Past 24 Hours? No  How Long Ago Did You Use Drugs or Alcohol? No data recorded What Did You Use and How Much? No data recorded  Do You Currently Have a Therapist/Psychiatrist? Yes  Name of Therapist/Psychiatrist: Psychiatrist: Dr. Hardie Lora at [facility]; Therapist: Lynetta Mare   Have You Been Recently Discharged From Any Office Practice or Programs? No  Explanation of Discharge From Practice/Program: No data recorded    CCA Screening Triage Referral Assessment Type of Contact: Face-to-Face  Is this  Initial or Reassessment? No data recorded Date Telepsych consult ordered in [facility]:  07/28/2020  Time Telepsych consult ordered in [facility]:  1602   Patient Reported Information Reviewed? Yes  Patient Left Without Being Seen? No data recorded Reason for Not Completing Assessment: No data recorded  Collateral Involvement: Mother, Britta Mccreedy   Does Patient Have a Automotive engineer Guardian? No data recorded Name and Contact of Legal Guardian: Self  If Minor and Not Living with Parent(s), Who has Custody? NA  Is CPS involved or ever been involved? Never  Is APS involved or ever been involved? Never   Patient Determined To Be At Risk for Harm To Self or Others Based on Review of Patient Reported Information or Presenting Complaint? Yes, for Self-Harm (Mother is present and aware of SI.)  Method: No data recorded Availability of Means: No data recorded Intent: No data recorded Notification Required: No data recorded Additional Information for Danger to Others Potential: No data recorded Additional Comments for Danger to Others Potential: No data recorded Are There Guns or Other Weapons in Your Home? No data recorded Types of Guns/Weapons: No data recorded Are These Weapons Safely Secured?                            No data recorded Who Could Verify You Are Able To Have These Secured: No data recorded Do You Have any Outstanding Charges, Pending Court Dates, Parole/Probation? No data recorded Contacted To Inform of Risk of Harm To Self or Others: -- (Mother is present and aware.)   Location of Assessment: [facility]   Does Patient Present under Involuntary Commitment? No  IVC Papers Initial File Date: No data recorded  [place] of Residence: [place]   Patient Currently Receiving the Following Services: Individual Therapy; Medication Management   Determination of Need: Emergent (2 hours)   Options For Referral: Inpatient Hospitalization     CCA  Biopsychosocial Intake/Chief Complaint:  Pt was seen today by his psychiatrist, Dr. Jannifer Franklin.  He told psychiatrist he was feeling suicidal today.  So psychiatrist encouraged mother to bring him to have an assessment.  Patient was brought to [facility] where Dr. Virgina Norfolk saw him and sent him to [facility].  Patient presents with current SI and plan to stab himself.  Pt says "I have different plans."  Patient had attempted to shoot himself in November of 2021.  Patient has had multiple attempts in the past.  Patient denies any current HI or A/V hallucinations.  Patient admits to some marijuana use but says it is seldom, less than once a month.  Current Symptoms/Problems: Pt has SI w/ plan.  Multiple past attempts.  No HI or A/V hallucinations.   Patient Reported Schizophrenia/Schizoaffective Diagnosis in Past: No   Strengths: Pt is willing to engage in treatment, has safe living environment, some support  Preferences: No data recorded Abilities: No data recorded  Type of Services Patient Feels are Needed: intensive  Initial Clinical Notes/Concerns: Pt does not talk much.  Looks down a lot.   Mental Health Symptoms Depression:  Difficulty Concentrating; Change in energy/activity; Hopelessness; Worthlessness; Tearfulness   Duration of Depressive symptoms: Greater than two weeks   Mania:  None   Anxiety:   Worrying; Tension; Restlessness   Psychosis:  None   Duration of Psychotic symptoms: No data recorded  Trauma:  None   Obsessions:  None   Compulsions:  None   Inattention:  None   Hyperactivity/Impulsivity:  N/A   Oppositional/Defiant Behaviors:  None   Emotional Irregularity:  No data recorded  Other Mood/Personality Symptoms:  Feeling down, hopeless.    Mental Status Exam Appearance and self-care  Stature:  Average   Weight:  Thin   Clothing:  Casual   Grooming:  Normal   Cosmetic use:  None   Posture/gait:  Normal   Motor activity:  Not Remarkable    Sensorium  Attention:  Normal   Concentration:  Scattered   Orientation:  X5   Recall/memory:  Normal   Affect and Mood  Affect:  Congruent; Depressed; Blunted   Mood:  Depressed   Relating  Eye contact:  Avoided   Facial expression:  Depressed; Sad   Attitude toward examiner:  Cooperative; Guarded; Uninterested   Thought and Language  Speech flow: Normal   Thought content:  Appropriate to Mood and Circumstances   Preoccupation:  Suicide   Hallucinations:  None   Organization:  No data recorded  [employer] of Knowledge:  Average   Intelligence:  Average   Abstraction:  Functional   Judgement:  Fair   Reality Testing:  Realistic   Insight:  Fair   Decision Making:  Impulsive   Social Functioning  Social Maturity:  No data recorded  Social Judgement:  No data recorded  Stress  Stressors:  Other (Comment) (Pt does not identify stressors.)   Coping Ability:  Overwhelmed   Skill Deficits:  Self-care   Supports:  Friends/Service system; Family     Religion:    Leisure/Recreation:    Exercise/Diet: Exercise/Diet Have You Gained or Lost A Significant Amount of Weight in the Past Six Months?: No Do You Follow a Special Diet?: No Do You Have Any Trouble Sleeping?: No   CCA Employment/Education Employment/Work Situation: Employment / Work Situation Has patient ever been in the [employer]?: No  Education: Education Is Patient Currently Attending School?: Yes School Currently Attending: [employer] Did Garment/textile technologist From [employer]?: Yes   CCA Family/Childhood History Family and Relationship History: Family history Marital status: Single Does patient have children?: No  Childhood History:  Childhood History By whom was/is the patient raised?: Both parents Did patient suffer any verbal/emotional/physical/sexual abuse as a child?: No Did patient suffer from severe childhood neglect?: No Has patient ever been sexually  abused/assaulted/raped as an adolescent or adult?: No Was the patient ever a victim of a crime or a disaster?: No Witnessed domestic violence?: No Has patient been affected by domestic violence as an adult?: No  Child/Adolescent Assessment:     CCA Substance Use Alcohol/Drug Use: Alcohol / Drug Use Pain Medications: None Prescriptions: Abilify 5mg  once daily; Zoloft 200mg  once daily Over the Counter: None History of alcohol / drug use?: Yes Substance #1 Name of Substance 1: Marijuana 1 - Age of First Use: 20 years of age 79 - Amount (size/oz): Varies 1 - Frequency: <1x/M 1 - Duration: off and on 1 - Last Use / Amount: Pt can't recall  1 - Method of Aquiring: Unknown 1- Route of Use: inhale                       ASAM's:  Six Dimensions of Multidimensional Assessment  Dimension 1:  Acute Intoxication and/or Withdrawal Potential:      Dimension 2:  Biomedical Conditions and Complications:      Dimension 3:  Emotional, Behavioral, or Cognitive Conditions and Complications:     Dimension 4:  Readiness to Change:     Dimension 5:  Relapse, Continued use, or Continued Problem Potential:     Dimension 6:  Recovery/Living Environment:     ASAM Severity Score:    ASAM Recommended Level of Treatment:     Substance use Disorder (SUD)    Recommendations for Services/Supports/Treatments:    DSM5 Diagnoses: Patient Active Problem List   Diagnosis Date Noted  . Suicidal ideation   . MDD (major depressive disorder), recurrent severe, without psychosis (HCC)   . Anxiety and depression 07/15/2020  . GERD (gastroesophageal reflux disease) 07/15/2020  . Preventative health care 07/15/2020  . Pectus excavatum 07/15/2020  . Transient alteration of awareness 07/16/2015  . 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

## 2020-10-14 NOTE — ED Provider Notes (Signed)
[name]-EMERGENCY DEPT Provider Note   CSN: [id number] Arrival date & time: 10/14/20  1751     History Chief Complaint  Patient presents with  . Suicidal    Jesse Schultz is a 20 y.o. male.  The history is provided by the patient and a caregiver.  Mental Health Problem Presenting symptoms: suicidal thoughts   Patient accompanied by:  Caregiver Degree of incapacity (severity):  Mild Onset quality:  Gradual Timing:  Intermittent Progression:  Waxing and waning Chronicity:  Recurrent Relieved by:  Nothing Worsened by:  Nothing Associated symptoms: no abdominal pain and no chest pain   Risk factors: hx of mental illness        Past Medical History:  Diagnosis Date  . Allergy   . Anxiety   . Asthma   . Depression   . Fainting spell   . GERD (gastroesophageal reflux disease)     Patient Active Problem List   Diagnosis Date Noted  . Suicidal ideation   . MDD (major depressive disorder), recurrent severe, without psychosis (HCC)   . Anxiety and depression 07/15/2020  . GERD (gastroesophageal reflux disease) 07/15/2020  . Preventative health care 07/15/2020  . Pectus excavatum 07/15/2020  . Transient alteration of awareness 07/16/2015  . 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 ADENOIDECTOMY AND MYRINGOTOMY WITH TUBE PLACEMENT    . CIRCUMCISION    . PECTUS BAR INSERTIONPEDIATRIC  2021  . TYMPANOPLASTY    . TYMPANOSTOMY TUBE PLACEMENT    . WISDOM TOOTH EXTRACTION         Family History  Problem Relation Age of Onset  . Allergies Mother   . Asthma Maternal Grandmother   . Allergies Maternal Grandmother   . Heart disease Maternal Grandfather     Social History   Tobacco Use  . Smoking status: Never Smoker  . Smokeless tobacco: Never Used  Vaping Use  . Vaping Use: Never used  Substance Use Topics  . Alcohol use: No  . Drug use: No     Home Medications Prior to Admission medications   Medication Sig Start Date End Date Taking? Authorizing Provider  acetaminophen (TYLENOL) 500 MG tablet Take 500 mg by mouth every 6 (six) hours as needed.    [provider]  ARIPiprazole (ABILIFY) 2 MG tablet Take 1 tablet (2 mg total) by mouth daily. Patient not taking: Reported on 08/19/2020 08/17/20   [name], NP  ARIPiprazole (ABILIFY) 5 MG tablet Take by mouth. 09/09/19   [provider]  ibuprofen (ADVIL,MOTRIN) 200 MG tablet Take 200 mg by mouth every 6 (six) hours as needed.    [provider]  loratadine (CLARITIN) 10 MG tablet Take 10 mg by mouth daily.    [provider]  omeprazole (PRILOSEC) 20 MG capsule Take 20 mg by mouth daily.    [provider]  sertraline (ZOLOFT) 100 MG tablet Take by mouth. 09/05/19   [provider]    Allergies    Other  Review of Systems   Review of Systems  Constitutional: Negative for chills and fever.  HENT: Negative for ear pain and sore throat.   Eyes: Negative for pain and visual disturbance.  Respiratory: Negative for cough and shortness of breath.   Cardiovascular: Negative for chest pain and palpitations.  Gastrointestinal: Negative for abdominal pain and vomiting.  Genitourinary: Negative for dysuria and hematuria.  Musculoskeletal: Negative for arthralgias and  back pain.  Skin: Negative for color change and rash.  Neurological: Negative for seizures and syncope.  Psychiatric/Behavioral: Positive for suicidal ideas.  All other systems reviewed and are negative.   Physical Exam Updated Vital Signs BP 114/61   Pulse 72   Temp 99 F (37.2 C) (Oral)   Resp 18   Ht 5\' 9"  (1.753 m)   Wt 63.2 kg   SpO2 97%   BMI 20.58 kg/m   Physical Exam Vitals and nursing note reviewed.  Constitutional:      Appearance: He is well-developed and well-nourished.  HENT:     Head: Normocephalic and atraumatic.  Eyes:      Conjunctiva/sclera: Conjunctivae normal.  Cardiovascular:     Rate and Rhythm: Normal rate and regular rhythm.     Heart sounds: No murmur heard.   Pulmonary:     Effort: Pulmonary effort is normal. No respiratory distress.     Breath sounds: Normal breath sounds.  Abdominal:     Palpations: Abdomen is soft.     Tenderness: There is no abdominal tenderness.  Musculoskeletal:        General: No edema.     Cervical back: Neck supple.  Skin:    General: Skin is warm and dry.  Neurological:     Mental Status: He is alert.  Psychiatric:        Mood and Affect: Mood and affect normal.        Thought Content: Thought content includes suicidal ideation.     ED Results / Procedures / Treatments   Labs (all labs ordered are listed, but only abnormal results are displayed) Labs Reviewed - No data to display  EKG None  Radiology No results found.  Procedures Procedures   Medications Ordered in ED Medications - No data to display  ED Course  I have reviewed the triage vital signs and the nursing notes.  Pertinent labs & imaging results that were available during my care of the patient were reviewed by me and considered in my medical decision making (see chart for details).    MDM Rules/Calculators/A&P                          Jesse Schultz is a 20 year old male with history of depression who presents the ED with suicidal ideation.  Normal vitals.  No fever.  Overall appears well.  Here with his mother who drove him.  Patient is voluntary.  He states he is having increasing suicidal thoughts.  History of the same.  Has been compliant with his medications.  Denies any alcohol or drug use.  Having thoughts of hurting himself and wants to seek help.  I talked with the nurse practitioner behavioral health Shuvon Rankin and she accepts the patient in transfer to behavioral health as patient is voluntary and medically stable and cleared.  I talked with the patient's mother who will  drive him over to the facility as she feels comfortable with that.  Discharged to behavioral health in good condition.  This chart was dictated using voice recognition software.  Despite best efforts to proofread,  errors can occur which can change the documentation meaning.   Final Clinical Impression(s) / ED Diagnoses Final diagnoses:  Suicidal ideation    Rx / DC Orders ED Discharge Orders    None       12, DO 10/14/20 1859

## 2020-10-14 NOTE — Discharge Instructions (Addendum)
Head over to behavioral health center as discussed.

## 2020-10-14 NOTE — ED Provider Notes (Addendum)
Behavioral Health Admission H&P [facility])  Date: 10/14/20 Patient Name: Jesse Schultz MRN: [id number] Chief Complaint:  Chief Complaint  Patient presents with  . Suicidal  . Depression   Chief Complaint/Presenting Problem: Pt was seen today by his psychiatrist, Dr. Jannifer Franklin.  He told psychiatrist he was feeling suicidal today.  So psychiatrist encouraged mother to bring him to have an assessment.  Patient was brought to [facility] where Dr. Virgina Norfolk saw him and sent him to [facility].  Patient presents with current SI and plan to stab himself.  Pt says "I have different plans."  Patient had attempted to shoot himself in November of 2021.  Patient has had multiple attempts in the past.  Patient denies any current HI or A/V hallucinations.  Patient admits to some marijuana use but says it is seldom, less than once a month.  Diagnoses:  Final diagnoses:  Severe recurrent major depression without psychotic features (HCC)    HPI: Jesse Schultz is a 20 y.o. male with a history of MDD who presents to [facility] for an evaluation due to worsening depression and SI. Patient's mother is present during the assessment per patient's request. He reports that Dr. Jannifer Franklin referred him for an evaluation. Patient reports that he has been feeling more depressed for "a while." He states that he is having suicidal thoughts with a plan to stab himself. He states that he attempted to shoot himself in November but the gun did not fire. He reports previous attempts was not able to elaborate. Denies homicidal ideations. He denies auditory and visual hallucinations. He does not appear to be responding to interna stimuli. He reports occasional use of marijuana. Reports that he stopped drinking alcohol last week. Reports that was previously drinking alcohol every other weekend. Denies history of alcohol withdrawal symptoms.   On evaluation patient is alert and oriented x 4, pleasant, and cooperative. Speech is clear and  coherent, decreased in volume. Eye contact is minimal. Mood is depressed and affect is congruent with mood. Thought process is coherent and thought content is logical. Denies auditory and visual hallucinations. No indication that patient is responding to internal stimuli. No evidence of delusional thought content. Denies suicidal ideations. Denies homicidal ideations.    Patient was evaluated at [facility] in November of 2021.  He was referred to outpatient PHP and completed that program.  He is followed by Dr. Hardie Lora for medication management. He sees Lynetta Mare for therapy.  He denies any previous inpatient psychiatric care.     He reports that he takes three medications. He states that he takes Zoloft and hydroxyzine. He cannot recall the name of the third medication. Per PTA meds he was previously taking Abilify.    PHQ 2-9:  Advertising copywriter from 08/07/2020 in BEHAVIORAL HEALTH PARTIAL HOSPITALIZATION PROGRAM Office Visit from 07/15/2020 in [facility]  Thoughts that you would be better off dead, or of hurting yourself in some way Nearly every day Several days  PHQ-9 Total Score 9 5      Flowsheet Row ED from 10/14/2020 in [facility]OSPITAL-EMERGENCY DEPT ED from 07/28/2020 in [facility]  C-SSRS RISK CATEGORY High Risk High Risk       Total Time spent with patient: 20 minutes  Musculoskeletal  Strength & Muscle Tone: within normal limits Gait & Station: normal Patient leans: N/A  Psychiatric Specialty Exam  Presentation General Appearance: Appropriate for Environment; Neat  Eye Contact:Minimal  Speech:Clear  and Coherent; Normal Rate  Speech Volume:Decreased  Handedness:No data recorded  Mood and Affect  Mood:Anxious; Depressed  Affect:Depressed   Thought Process  Thought Processes:Coherent  Descriptions of Associations:Intact  Orientation:Full (Time, Place and Person)  Thought  Content:Logical  Hallucinations:Hallucinations: None  Ideas of Reference:None  Suicidal Thoughts:Suicidal Thoughts: Yes, Active SI Active Intent and/or Plan: With Intent; With Plan; With Means to Carry Out  Homicidal Thoughts:Homicidal Thoughts: No   Sensorium  Memory:Immediate Good; Recent Fair; Remote Fair  Judgment:Impaired  Insight:Lacking   Executive Functions  Concentration:Fair  Attention Span:Fair  Recall:Fair  Fund of Knowledge:Good  Language:Good   Psychomotor Activity  Psychomotor Activity:Psychomotor Activity: Normal   Assets  Assets:Communication Skills; Desire for Improvement; Financial Resources/Insurance; Housing; Physical Health; Social Support   Sleep  Sleep:Sleep: Fair   Physical Exam Vitals and nursing note reviewed.  Constitutional:      General: He is not in acute distress.    Appearance: He is not ill-appearing, toxic-appearing or diaphoretic.  HENT:     Head: Normocephalic.     Right Ear: External ear normal.     Left Ear: External ear normal.  Eyes:     Pupils: Pupils are equal, round, and reactive to light.  Cardiovascular:     Rate and Rhythm: Normal rate.  Pulmonary:     Effort: Pulmonary effort is normal. No respiratory distress.  Musculoskeletal:        General: Normal range of motion.  Skin:    General: Skin is warm and dry.  Neurological:     Mental Status: He is alert and oriented to person, place, and time.  Psychiatric:        Mood and Affect: Mood is anxious and depressed.        Speech: Speech normal.        Behavior: Behavior is cooperative.        Thought Content: Thought content is not paranoid or delusional. Thought content includes suicidal ideation. Thought content does not include homicidal ideation. Thought content includes suicidal plan.    Review of Systems  Constitutional: Negative for chills, diaphoresis, fever, malaise/fatigue and weight loss.  HENT: Negative for congestion.   Respiratory:  Negative for cough and shortness of breath.   Cardiovascular: Negative for chest pain and palpitations.  Gastrointestinal: Negative for diarrhea, nausea and vomiting.  Neurological: Negative for dizziness and seizures.  Psychiatric/Behavioral: Positive for depression and suicidal ideas. Negative for hallucinations, memory loss and substance abuse. The patient is nervous/anxious and has insomnia.   All other systems reviewed and are negative.   Blood pressure (!) 115/51, pulse 73, temperature 98.4 F (36.9 C), temperature source Oral, resp. rate 18, SpO2 99 %. There is no height or weight on file to calculate BMI.  Past Psychiatric History: MDD  Is the patient at risk to self? Yes  Has the patient been a risk to self in the past 6 months? Yes .    Has the patient been a risk to self within the distant past? Yes   Is the patient a risk to others? No   Has the patient been a risk to others in the past 6 months? No   Has the patient been a risk to others within the distant past? No   Past Medical History:  Past Medical History:  Diagnosis Date  . Allergy   . Anxiety   . Asthma   . Depression   . Fainting spell   . GERD (gastroesophageal reflux disease)  Past Surgical History:  Procedure Laterality Date  . ADENOIDECTOMY AND MYRINGOTOMY WITH TUBE PLACEMENT    . CIRCUMCISION    . PECTUS BAR INSERTIONPEDIATRIC  2021  . TYMPANOPLASTY    . TYMPANOSTOMY TUBE PLACEMENT    . WISDOM TOOTH EXTRACTION      Family History:  Family History  Problem Relation Age of Onset  . Allergies Mother   . Asthma Maternal Grandmother   . Allergies Maternal Grandmother   . Heart disease Maternal Grandfather     Social History:  Social History   Socioeconomic History  . Marital status: Single    Spouse name: Not on file  . Number of children: 0  . Years of education: Not on file  . Highest education level: High school graduate  Occupational History  . Not on file  Tobacco Use  .  Smoking status: Never Smoker  . Smokeless tobacco: Never Used  Vaping Use  . Vaping Use: Never used  Substance and Sexual Activity  . Alcohol use: No  . Drug use: No  . Sexual activity: Yes  Other Topics Concern  . Not on file  Social History Narrative   Kartel is in ninth grade at [employer]. He is meeting most of the goals on his IEP.He struggles with math.   Rafael lives with his parents, and two brothers.   Social Determinants of Health   Financial Resource Strain: Not on file  Food Insecurity: Not on file  Transportation Needs: Not on file  Physical Activity: Not on file  Stress: Not on file  Social Connections: Not on file  Intimate Partner Violence: Not on file    SDOH:  SDOH Screenings   Alcohol Screen: Not on file  Depression (PHQ2-9): Medium Risk  . PHQ-2 Score: 9  Financial Resource Strain: Not on file  Food Insecurity: Not on file  Housing: Not on file  Physical Activity: Not on file  Social Connections: Not on file  Stress: Not on file  Tobacco Use: Low Risk   . Smoking Tobacco Use: Never Smoker  . Smokeless Tobacco Use: Never Used  Transportation Needs: Not on file    Last Labs:  Admission on 07/28/2020, Discharged on 07/29/2020  Component Date Value Ref Range Status  . Sodium 07/28/2020 139  135 - 145 mmol/L Final  . Potassium 07/28/2020 3.4* 3.5 - 5.1 mmol/L Final  . Chloride 07/28/2020 100  98 - 111 mmol/L Final  . CO2 07/28/2020 28  22 - 32 mmol/L Final  . Glucose, Bld 07/28/2020 105* 70 - 99 mg/dL Final   Glucose reference range applies only to samples taken after fasting for at least 8 hours.  . BUN 07/28/2020 10  6 - 20 mg/dL Final  . Creatinine, Ser 07/28/2020 0.56* 0.61 - 1.24 mg/dL Final  . Calcium 16/57/9038 9.3  8.9 - 10.3 mg/dL Final  . Total Protein 07/28/2020 8.4* 6.5 - 8.1 g/dL Final  . Albumin 33/38/3291 5.4* 3.5 - 5.0 g/dL Final  . AST 91/66/0600 23  15 - 41 U/L Final  . ALT 07/28/2020 18  0 - 44 U/L Final  . Alkaline  Phosphatase 07/28/2020 88  38 - 126 U/L Final  . Total Bilirubin 07/28/2020 0.8  0.3 - 1.2 mg/dL Final  . GFR, Estimated 07/28/2020 >60  >60 mL/min Final   Comment: (NOTE) Calculated using the CKD-EPI Creatinine Equation (2021)   . Anion gap 07/28/2020 11  5 - 15 Final   Performed at [facility],  [address], [place], [place] [address]  . Alcohol, Ethyl (B) 07/28/2020 162* <10 mg/dL Final   Comment: (NOTE) Lowest detectable limit for serum alcohol is 10 mg/dL.  For medical purposes only. Performed at [facility], [address], [place], [place] [address]   . WBC 07/28/2020 9.8  4.0 - 10.5 K/uL Final  . RBC 07/28/2020 4.81  4.22 - 5.81 MIL/uL Final  . Hemoglobin 07/28/2020 15.2  13.0 - 17.0 g/dL Final  . HCT 82/70/7867 44.3  39.0 - 52.0 % Final  . MCV 07/28/2020 92.1  80.0 - 100.0 fL Final  . MCH 07/28/2020 31.6  26.0 - 34.0 pg Final  . MCHC 07/28/2020 34.3  30.0 - 36.0 g/dL Final  . RDW 54/49/2010 11.9  11.5 - 15.5 % Final  . Platelets 07/28/2020 193  150 - 400 K/uL Final  . nRBC 07/28/2020 0.0  0.0 - 0.2 % Final  . Neutrophils Relative % 07/28/2020 84  % Final  . Neutro Abs 07/28/2020 8.2* 1.7 - 7.7 K/uL Final  . Lymphocytes Relative 07/28/2020 13  % Final  . Lymphs Abs 07/28/2020 1.2  0.7 - 4.0 K/uL Final  . Monocytes Relative 07/28/2020 3  % Final  . Monocytes Absolute 07/28/2020 0.3  0.1 - 1.0 K/uL Final  . Eosinophils Relative 07/28/2020 0  % Final  . Eosinophils Absolute 07/28/2020 0.0  0.0 - 0.5 K/uL Final  . Basophils Relative 07/28/2020 0  % Final  . Basophils Absolute 07/28/2020 0.0  0.0 - 0.1 K/uL Final  . Immature Granulocytes 07/28/2020 0  % Final  . Abs Immature Granulocytes 07/28/2020 0.02  0.00 - 0.07 K/uL Final   Performed at [facility], [address] [place], [place] [address]  . Opiates 07/28/2020 NONE DETECTED  NONE DETECTED Final  . Cocaine 07/28/2020 NONE DETECTED  NONE DETECTED Final  .  Benzodiazepines 07/28/2020 NONE DETECTED  NONE DETECTED Final  . Amphetamines 07/28/2020 NONE DETECTED  NONE DETECTED Final  . Tetrahydrocannabinol 07/28/2020 NONE DETECTED  NONE DETECTED Final  . Barbiturates 07/28/2020 NONE DETECTED  NONE DETECTED Final   Comment: (NOTE) DRUG SCREEN FOR MEDICAL PURPOSES ONLY.  IF CONFIRMATION IS NEEDED FOR ANY PURPOSE, NOTIFY LAB WITHIN 5 DAYS.  LOWEST DETECTABLE LIMITS FOR URINE DRUG SCREEN Drug Class                     Cutoff (ng/mL) Amphetamine and metabolites    1000 Barbiturate and metabolites    200 Benzodiazepine                 200 Tricyclics and metabolites     300 Opiates and metabolites        300 Cocaine and metabolites        300 THC                            50 Performed at [facility], [address], [place], [place] [address]   . SARS Coronavirus 2 by RT PCR 07/28/2020 NEGATIVE  NEGATIVE Final   Comment: (NOTE) SARS-CoV-2 target nucleic acids are NOT DETECTED.  The SARS-CoV-2 RNA is generally detectable in upper respiratory specimens during the acute phase of infection. The lowest concentration of SARS-CoV-2 viral copies this assay can detect is 138 copies/mL. A negative result does not preclude SARS-Cov-2 infection and should not be used as the sole basis for treatment or other patient management  decisions. A negative result may occur with  improper specimen collection/handling, submission of specimen other than nasopharyngeal swab, presence of viral mutation(s) within the areas targeted by this assay, and inadequate number of viral copies(<138 copies/mL). A negative result must be combined with clinical observations, patient history, and epidemiological information. The expected result is Negative.  Fact Sheet for Patients:  BloggerCourse.com  Fact Sheet for Healthcare Providers:  SeriousBroker.it  This test is no                          t yet  approved or cleared by the [place] FDA and  has been authorized for detection and/or diagnosis of SARS-CoV-2 by FDA under an Emergency Use Authorization (EUA). This EUA will remain  in effect (meaning this test can be used) for the duration of the COVID-19 declaration under Section 564(b)(1) of the Act, 21 U.S.C.section 360bbb-3(b)(1), unless the authorization is terminated  or revoked sooner.      . Influenza A by PCR 07/28/2020 NEGATIVE  NEGATIVE Final  . Influenza B by PCR 07/28/2020 NEGATIVE  NEGATIVE Final   Comment: (NOTE) The Xpert Xpress SARS-CoV-2/FLU/RSV plus assay is intended as an aid in the diagnosis of influenza from Nasopharyngeal swab specimens and should not be used as a sole basis for treatment. Nasal washings and aspirates are unacceptable for Xpert Xpress SARS-CoV-2/FLU/RSV testing.  Fact Sheet for Patients: BloggerCourse.com  Fact Sheet for Healthcare Providers: SeriousBroker.it  This test is not yet approved or cleared by the [place] FDA and has been authorized for detection and/or diagnosis of SARS-CoV-2 by FDA under an Emergency Use Authorization (EUA). This EUA will remain in effect (meaning this test can be used) for the duration of the COVID-19 declaration under Section 564(b)(1) of the Act, 21 U.S.C. section 360bbb-3(b)(1), unless the authorization is terminated or revoked.  Performed at [facility], [address] [place], [place] [address]     Allergies: Other  PTA Medications: (Not in a hospital admission)   Medical Decision Making  Admission labs ordered  Continue sertraline 150 mg daily for depression Continue Abilify 10 mg daily for depression augmentation  Clinical Course as of 10/15/20 0112  Mon Oct 14, 2020  2321 CBC with Differential/Platelet CBC unremarkable [JB]  2321 POC Marijuana UR(!): Positive UDS positive for marijuana otherwise negative  [JB]  2322 SARS Coronavirus 2 Ag: Negative [JB]  Tue Oct 15, 2020  0111 TSH: 1.733 [JB]  0111 Lipid panel Lipid Panel unremarkable [JB]  0111 Hemoglobin A1C: 5.0 [JB]  0111 Comprehensive metabolic panel(!) CMP unremarkable [JB]    Clinical Course User Index [JB] Jackelyn Poling, NP    Recommendations  Based on my evaluation the patient does not appear to have an emergency medical condition.   Recommend inpatient treatment. Fransico Michael, [facility] reviewing for bed placement.   Jackelyn Poling, NP 10/14/20  9:19 PM

## 2020-10-14 NOTE — ED Triage Notes (Signed)
Presents with depression and suicidal thoughts increasing in severity x 1 week.  Pt reports he would harm himself anyway he could.  Admits to previous attempt by gun, states gun wouldn't go off.  Denies HI or AVH.

## 2020-10-14 NOTE — ED Triage Notes (Signed)
Pt arrived via POV. Pt has hx of depression and states he wants to commit suicide. Pt has a plan for suicide, he states he wants to stab himself in the neck with a ballpoint pen. Pt denies any pain.

## 2020-10-15 ENCOUNTER — Inpatient Hospital Stay (HOSPITAL_COMMUNITY)
Admission: AD | Admit: 2020-10-15 | Discharge: 2020-10-21 | DRG: 885 | Disposition: A | Discharge status: Home / Self Care | Payer: BC Managed Care – PPO | Source: Ambulatory Visit | Attending: Psychiatry | Admitting: Psychiatry

## 2020-10-15 ENCOUNTER — Other Ambulatory Visit: Payer: Self-pay

## 2020-10-15 ENCOUNTER — Encounter (HOSPITAL_COMMUNITY): Payer: Self-pay | Admitting: Nurse Practitioner

## 2020-10-15 DIAGNOSIS — R45851 Suicidal ideations: Secondary | ICD-10-CM | POA: Diagnosis present

## 2020-10-15 DIAGNOSIS — Z9151 Personal history of suicidal behavior: Secondary | ICD-10-CM

## 2020-10-15 DIAGNOSIS — G4700 Insomnia, unspecified: Secondary | ICD-10-CM | POA: Diagnosis present

## 2020-10-15 DIAGNOSIS — K5909 Other constipation: Secondary | ICD-10-CM | POA: Diagnosis present

## 2020-10-15 DIAGNOSIS — K219 Gastro-esophageal reflux disease without esophagitis: Secondary | ICD-10-CM | POA: Diagnosis present

## 2020-10-15 DIAGNOSIS — F332 Major depressive disorder, recurrent severe without psychotic features: Secondary | ICD-10-CM | POA: Diagnosis present

## 2020-10-15 DIAGNOSIS — Z20822 Contact with and (suspected) exposure to covid-19: Secondary | ICD-10-CM | POA: Diagnosis present

## 2020-10-15 DIAGNOSIS — Z62810 Personal history of physical and sexual abuse in childhood: Secondary | ICD-10-CM | POA: Diagnosis present

## 2020-10-15 DIAGNOSIS — F410 Panic disorder [episodic paroxysmal anxiety] without agoraphobia: Secondary | ICD-10-CM | POA: Diagnosis present

## 2020-10-15 HISTORY — DX: Major depressive disorder, recurrent severe without psychotic features: F33.2

## 2020-10-15 LAB — COMPREHENSIVE METABOLIC PANEL
ALT: 18 U/L (ref 0–44)
AST: 18 U/L (ref 15–41)
Albumin: 4.5 g/dL (ref 3.5–5.0)
Alkaline Phosphatase: 78 U/L (ref 38–126)
Anion gap: 10 (ref 5–15)
BUN: 9 mg/dL (ref 6–20)
CO2: 27 mmol/L (ref 22–32)
Calcium: 9.3 mg/dL (ref 8.9–10.3)
Chloride: 103 mmol/L (ref 98–111)
Creatinine, Ser: 0.68 mg/dL (ref 0.61–1.24)
GFR, Estimated: >60 mL/min (ref >60)
Glucose, Bld: 109 mg/dL — ABNORMAL HIGH (ref 70–99)
Potassium: 3.6 mmol/L (ref 3.5–5.1)
Sodium: 140 mmol/L (ref 135–145)
Total Bilirubin: 0.5 mg/dL (ref 0.3–1.2)
Total Protein: 7.3 g/dL (ref 6.5–8.1)

## 2020-10-15 LAB — LIPID PANEL
Cholesterol: 122 mg/dL (ref 0–200)
HDL: 45 mg/dL (ref >40)
LDL Cholesterol: 63 mg/dL (ref 0–99)
Total CHOL/HDL Ratio: 2.7 RATIO
Triglycerides: 69 mg/dL (ref <150)
VLDL: 14 mg/dL (ref 0–40)

## 2020-10-15 LAB — TSH: TSH: 1.733 u[IU]/mL (ref 0.350–4.500)

## 2020-10-15 MED ORDER — TRAZODONE HCL 50 MG PO TABS
50.0000 mg | ORAL_TABLET | Freq: Every evening | ORAL | Status: DC | PRN
  Administered 2020-10-15 – 2020-10-20 (×5): 50 mg via ORAL
  Filled 2020-10-15 (×7): qty 1

## 2020-10-15 MED ORDER — ARIPIPRAZOLE 10 MG PO TABS
10.0000 mg | ORAL_TABLET | Freq: Every day | ORAL | Status: DC
  Administered 2020-10-16 – 2020-10-21 (×6): 10 mg via ORAL
  Filled 2020-10-15 (×8): qty 1

## 2020-10-15 MED ORDER — ACETAMINOPHEN 325 MG PO TABS: 650.0000 mg | ORAL_TABLET | Freq: Four times a day (QID) | ORAL | Status: DC | PRN

## 2020-10-15 MED ORDER — ALUM & MAG HYDROXIDE-SIMETH 200-200-20 MG/5ML PO SUSP: 30.0000 mL | ORAL | Status: DC | PRN

## 2020-10-15 MED ORDER — ARIPIPRAZOLE 10 MG PO TABS
10.0000 mg | ORAL_TABLET | Freq: Every day | ORAL | Status: DC
  Administered 2020-10-15: 10 mg via ORAL
  Filled 2020-10-15: qty 1

## 2020-10-15 MED ORDER — SERTRALINE HCL 50 MG PO TABS
150.0000 mg | ORAL_TABLET | Freq: Every day | ORAL | Status: DC
  Administered 2020-10-16 – 2020-10-21 (×6): 150 mg via ORAL
  Filled 2020-10-15 (×8): qty 3

## 2020-10-15 MED ORDER — MAGNESIUM HYDROXIDE 400 MG/5ML PO SUSP: 30.0000 mL | Freq: Every day | ORAL | Status: DC | PRN

## 2020-10-15 MED ORDER — HYDROXYZINE HCL 25 MG PO TABS
25.0000 mg | ORAL_TABLET | Freq: Three times a day (TID) | ORAL | Status: DC | PRN
  Administered 2020-10-15 – 2020-10-20 (×6): 25 mg via ORAL
  Filled 2020-10-15 (×7): qty 1

## 2020-10-15 NOTE — ED Notes (Signed)
Patient A&O x 4, ambulatory. Patient denied SI/HI, A/VH upon transfer to [facility]. Pt continue to endorse SI at discharge. Pt belongings returned to patient from locker #13 intact. Patient escorted to lobby via staff for transport to [facility] for continuum of care via safe transport. Safety maintained.

## 2020-10-15 NOTE — ED Notes (Signed)
Pt sleeping at present, no distress noted, monitoring for safety. 

## 2020-10-15 NOTE — Progress Notes (Signed)
Patient is 20 yrs old, voluntary.  Stated he did not want to discuss reasons for feeling suicidal at times.  Stated he does have money and family problems.  Lives with his mother Jesse Schultz in [place].  Attends [employer] art classes.   Has increased anxiety and depression.  Denied physical, verbal, sexual abuse.  Denied HI.  Denied A/V hallucinations.  Last cut his arms in November 2021.  Denied tobacco use, denied THC, heroin, cocaine.  Stated he does drink 2-3 beers every week.  Went to [employer] in November 2021 for cutting.  Wears glasses.  Last  appointment with Dr. Chestine Spore, [place], in November.  Old healed cuts on arms.   Fall risk information reviewed, low fall risk.  Patient oriented to 300 hall, given food/drink.

## 2020-10-15 NOTE — ED Provider Notes (Signed)
FBC/OBS ASAP Discharge Summary  Date and Time: 10/15/2020 2:17 AM  Name: Jesse Schultz  MRN:  [id number]   Discharge Diagnoses:  Final diagnoses:  Severe recurrent major depression without psychotic features [facility])   Patient has been accepted to to [facility] for inpatient psychiatric treatment. Bed 303-1. May transfer after 8 am.   Jesse Schultz is a 20 y.o. male with a history of MDD who presents to [facility] for an evaluation due to worsening depression and SI. Patient's mother is present during the assessment per patient's request. He reports that Dr. Jannifer Franklin referred him for an evaluation. Patient reports that he has been feeling more depressed for "a while." He states that he is having suicidal thoughts with a plan to stab himself. He states that he attempted to shoot himself in November but the gun did not fire. He reports previous attempts was not able to elaborate. Denies homicidal ideations. He denies auditory and visual hallucinations. He does not appear to be responding to interna stimuli. He reports occasional use of marijuana. Reports that he stopped drinking alcohol last week. Reports that was previously drinking alcohol every other weekend. Denies history of alcohol withdrawal symptoms.   On evaluation patient is alert and oriented x 4, pleasant, and cooperative. Speech is clear and coherent, decreased in volume. Eye contact is minimal. Mood is depressed and affect is congruent with mood. Thought process is coherent and thought content is logical. Denies auditory and visual hallucinations. No indication that patient is responding to internal stimuli. No evidence of delusional thought content. Denies suicidal ideations. Denies homicidal ideations.    Patient was evaluated at [facility] in November of 2021.  He was referred to outpatient PHP and completed that program.  He is followed by Dr. Hardie Lora for medication management. He sees Lynetta Mare for therapy.  He denies any previous  inpatient psychiatric care.     He reports that he takes three medications. He states that he takes Zoloft and hydroxyzine. He cannot recall the name of the third medication. Per PTA meds he is taking Abilify 10 mg daily.    Past Medical History:  Past Medical History:  Diagnosis Date  . Allergy   . Anxiety   . Asthma   . Depression   . Fainting spell   . GERD (gastroesophageal reflux disease)     Past Surgical History:  Procedure Laterality Date  . ADENOIDECTOMY AND MYRINGOTOMY WITH TUBE PLACEMENT    . CIRCUMCISION    . PECTUS BAR INSERTIONPEDIATRIC  2021  . TYMPANOPLASTY    . TYMPANOSTOMY TUBE PLACEMENT    . WISDOM TOOTH EXTRACTION     Family History:  Family History  Problem Relation Age of Onset  . Allergies Mother   . Asthma Maternal Grandmother   . Allergies Maternal Grandmother   . Heart disease Maternal Grandfather     Social History:  Social History   Substance and Sexual Activity  Alcohol Use No     Social History   Substance and Sexual Activity  Drug Use No    Social History   Socioeconomic History  . Marital status: Single    Spouse name: Not on file  . Number of children: 0  . Years of education: Not on file  . Highest education level: High school graduate  Occupational History  . Not on file  Tobacco Use  . Smoking status: Never Smoker  . Smokeless tobacco: Never Used  Vaping Use  . Vaping Use: Never used  Substance and Sexual Activity  . Alcohol use: No  . Drug use: No  . Sexual activity: Yes  Other Topics Concern  . Not on file  Social History Narrative   Tong is in ninth grade at [employer]. He is meeting most of the goals on his IEP.He struggles with math.   Ashok lives with his parents, and two brothers.   Social Determinants of Health   Financial Resource Strain: Not on file  Food Insecurity: Not on file  Transportation Needs: Not on file  Physical Activity: Not on file  Stress: Not on file  Social  Connections: Not on file   SDOH:  SDOH Screenings   Alcohol Screen: Not on file  Depression (PHQ2-9): Medium Risk  . PHQ-2 Score: 9  Financial Resource Strain: Not on file  Food Insecurity: Not on file  Housing: Not on file  Physical Activity: Not on file  Social Connections: Not on file  Stress: Not on file  Tobacco Use: Low Risk   . Smoking Tobacco Use: Never Smoker  . Smokeless Tobacco Use: Never Used  Transportation Needs: Not on file    Has this patient used any form of tobacco in the last 30 days? (Cigarettes, Smokeless Tobacco, Cigars, and/or Pipes) Prescription not provided because: transferred to inpatient facility.  Current Medications:  Current Facility-Administered Medications  Medication Dose Route Frequency Provider Last Rate Last Admin  . acetaminophen (TYLENOL) tablet 650 mg  650 mg Oral Q6H PRN Jackelyn Poling, NP      . alum & mag hydroxide-simeth (MAALOX/MYLANTA) 200-200-20 MG/5ML suspension 30 mL  30 mL Oral Q4H PRN Nira Conn A, NP      . hydrOXYzine (ATARAX/VISTARIL) tablet 25 mg  25 mg Oral TID PRN Nira Conn A, NP      . magnesium hydroxide (MILK OF MAGNESIA) suspension 30 mL  30 mL Oral Daily PRN Nira Conn A, NP      . sertraline (ZOLOFT) tablet 150 mg  150 mg Oral Daily Nira Conn A, NP      . traZODone (DESYREL) tablet 50 mg  50 mg Oral QHS PRN Jackelyn Poling, NP       Current Outpatient Medications  Medication Sig Dispense Refill  . hydrOXYzine (ATARAX/VISTARIL) 10 MG tablet Take 10 mg by mouth 2 (two) times daily.    Marland Kitchen acetaminophen (TYLENOL) 500 MG tablet Take 500 mg by mouth every 6 (six) hours as needed.    . ARIPiprazole (ABILIFY) 2 MG tablet Take 1 tablet (2 mg total) by mouth daily. (Patient not taking: Reported on 08/19/2020) 30 tablet 0  . ARIPiprazole (ABILIFY) 5 MG tablet Take by mouth.    Marland Kitchen ibuprofen (ADVIL,MOTRIN) 200 MG tablet Take 200 mg by mouth every 6 (six) hours as needed.    . loratadine (CLARITIN) 10 MG tablet Take 10 mg  by mouth daily.    Marland Kitchen omeprazole (PRILOSEC) 20 MG capsule Take 20 mg by mouth daily.    . sertraline (ZOLOFT) 100 MG tablet Take by mouth.      PTA Medications: (Not in a hospital admission)   Musculoskeletal  Strength & Muscle Tone: within normal limits Gait & Station: normal Patient leans: N/A  Psychiatric Specialty Exam  Presentation  General Appearance: Appropriate for Environment; Neat  Eye Contact:Minimal  Speech:Clear and Coherent; Normal Rate  Speech Volume:Decreased  Handedness:No data recorded  Mood and Affect  Mood:Anxious; Depressed  Affect:Depressed   Thought Process  Thought Processes:Coherent  Descriptions of Associations:Intact  Orientation:Full (Time, Place and Person)  Thought Content:Logical  Hallucinations:Hallucinations: None  Ideas of Reference:None  Suicidal Thoughts:Suicidal Thoughts: Yes, Active SI Active Intent and/or Plan: With Intent; With Plan; With Means to Carry Out  Homicidal Thoughts:Homicidal Thoughts: No   Sensorium  Memory:Immediate Good; Recent Fair; Remote Fair  Judgment:Impaired  Insight:Lacking   Executive Functions  Concentration:Fair  Attention Span:Fair  Recall:Fair  Fund of Knowledge:Good  Language:Good   Psychomotor Activity  Psychomotor Activity:Psychomotor Activity: Normal   Assets  Assets:Communication Skills; Desire for Improvement; Financial Resources/Insurance; Housing; Physical Health; Social Support   Sleep  Sleep:Sleep: Fair   Physical Exam  Physical Exam Constitutional:      General: He is not in acute distress.    Appearance: He is not ill-appearing, toxic-appearing or diaphoretic.  HENT:     Head: Normocephalic.     Right Ear: External ear normal.     Left Ear: External ear normal.  Eyes:     Pupils: Pupils are equal, round, and reactive to light.  Cardiovascular:     Rate and Rhythm: Normal rate.  Pulmonary:     Effort: Pulmonary effort is normal. No respiratory  distress.  Musculoskeletal:        General: Normal range of motion.  Skin:    General: Skin is warm and dry.  Neurological:     Mental Status: He is alert and oriented to person, place, and time.  Psychiatric:        Mood and Affect: Mood is depressed.        Speech: Speech normal.        Behavior: Behavior is cooperative.        Thought Content: Thought content is not paranoid or delusional. Thought content includes suicidal ideation. Thought content does not include homicidal ideation. Thought content includes suicidal plan.    Review of Systems  Constitutional: Negative for chills, diaphoresis, fever, malaise/fatigue and weight loss.  HENT: Negative for congestion.   Respiratory: Negative for cough and shortness of breath.   Cardiovascular: Negative for chest pain and palpitations.  Gastrointestinal: Negative for diarrhea, nausea and vomiting.  Neurological: Negative for dizziness and seizures.  Psychiatric/Behavioral: Positive for depression, substance abuse and suicidal ideas. Negative for hallucinations and memory loss. The patient is nervous/anxious and has insomnia.   All other systems reviewed and are negative.  Blood pressure (!) 115/51, pulse 73, temperature 98.4 F (36.9 C), temperature source Oral, resp. rate 18, SpO2 99 %. There is no height or weight on file to calculate BMI.   Disposition: Patient accepted to to [facility] for inpatient psychiatric treatment. Bed 303-1. May transfer after 8 am.   Jackelyn Poling, NP 10/15/2020, 2:17 AM

## 2020-10-15 NOTE — Progress Notes (Signed)
EKG completed and placed on patient's chart for review.

## 2020-10-15 NOTE — ED Notes (Signed)
AS PER [facility] KIM, PT ACCEPTED TO RM 303-1 AFTER 8AM.

## 2020-10-15 NOTE — BH Assessment (Addendum)
Clinician contacted Fransico Michael, [facility] who said that patient has been accepted to [facility] 303-1.  Pt accepted to services of Dr. Jola Babinski.  Pt can come after 08:00 on 02/08.  Nurse Doran Clay in [facility] informed.

## 2020-10-15 NOTE — Discharge Instructions (Signed)
Transfer to [facility] 

## 2020-10-15 NOTE — ED Notes (Signed)
Pt continue to endorse SI with plans but refuse to disclose. Pt states, "I have part of it worked out but the other part is in the making. I plan to carry it out as soon as I can". Pt unable to contract for safety. Pt to be transferred to [facility]. Safe transport called.

## 2020-10-15 NOTE — Progress Notes (Signed)
   10/15/20 2202  Psych Admission Type (Psych Patients Only)  Admission Status Voluntary  Psychosocial Assessment  Patient Complaints Depression;Anxiety  Eye Contact Fair  Facial Expression Sad;Anxious  Affect Anxious;Apprehensive;Sad;Depressed  Speech Logical/coherent  Interaction Other (Comment) (appropriate)  Motor Activity Other (Comment) (appropriate)  Appearance/Hygiene Unremarkable  Behavior Characteristics Cooperative  Mood Depressed;Anxious  Aggressive Behavior  Effect No apparent injury  Thought Process  Coherency WDL  Content WDL  Delusions None reported or observed  Perception WDL  Hallucination None reported or observed  Judgment WDL  Confusion None  Danger to Self  Current suicidal ideation? Passive  Self-Injurious Behavior No self-injurious ideation or behavior indicators observed or expressed   Agreement Not to Harm Self Yes  Description of Agreement contracts for safety verbal  Danger to Others  Danger to Others None reported or observed  D: Patient in dayroom mostly keeping to himself. Pt stated his main issue is getting back on the right medications.   A: Medications administered as prescribed. Support and encouragement provided as needed.  R: Patient remains safe on the unit. Will continue to monitor for safety and stability.

## 2020-10-15 NOTE — ED Notes (Signed)
Called safe transport  

## 2020-10-15 NOTE — Progress Notes (Signed)
Patient states that he is grateful that he is still breathing. He was unable to state a goal for tomorrow.

## 2020-10-15 NOTE — ED Notes (Signed)
Pt resting in no acute distress. RR even and unlabored. Safety maintained. 

## 2020-10-15 NOTE — ED Notes (Signed)
Myrtie Soman, RN, at [facility], with report for transport. Pt made aware and accepting.

## 2020-10-15 NOTE — Plan of Care (Signed)
Nurse discussed anxiety, depression, coping skills with patient. 

## 2020-10-15 NOTE — Tx Team (Signed)
Initial Treatment Plan 10/15/2020 4:46 PM ROSCO HARRIOTT Y[facility]:[id number]    PATIENT STRESSORS: Educational concerns Financial difficulties Marital or family conflict Occupational concerns   PATIENT STRENGTHS: Ability for insight Active sense of humor Average or above average intelligence Capable of independent living Wellsite geologist fund of knowledge Motivation for treatment/growth Supportive family/friends   PATIENT IDENTIFIED PROBLEMS: "suicidal thoughts"  "anxiety"  "depression"                 DISCHARGE CRITERIA:  Ability to meet basic life and health needs Adequate post-discharge living arrangements Improved stabilization in mood, thinking, and/or behavior Medical problems require only outpatient monitoring Motivation to continue treatment in a less acute level of care Need for constant or close observation no longer present Reduction of life-threatening or endangering symptoms to within safe limits Safe-care adequate arrangements made Verbal commitment to aftercare and medication compliance  PRELIMINARY DISCHARGE PLAN: Attend aftercare/continuing care group Attend PHP/IOP Attend 12-step recovery group Outpatient therapy Participate in family therapy Return to previous living arrangement  PATIENT/FAMILY INVOLVEMENT: This treatment plan has been presented to and reviewed with the patient, EDEM TIEGS..  The patient and family have been given the opportunity to ask questions and make suggestions.  Quintella Reichert [place], [place] 10/15/2020, 4:46 PM

## 2020-10-16 DIAGNOSIS — F332 Major depressive disorder, recurrent severe without psychotic features: Principal | ICD-10-CM

## 2020-10-16 NOTE — Progress Notes (Signed)
Recreation Therapy Notes  Date: 2.9.22 Time: 0930 Location: 500 Hall Dayroom  Group Topic: Stress Management  Goal Area(s) Addresses:  Patient will identify positive stress management techniques. Patient will identify benefits of using stress management post d/c.  Intervention: Stress Management  Activity: Meditation.  LRT played a body scan meditation that focused on taking inventory of any sensations and feelings the body may be going through.  Patients were to listen and follow along as the meditation was played    Education:  Stress Management, Discharge Planning.   Education Outcome: Acknowledges Education  Clinical Observations/Feedback: Pt did not attend group session.    Caroll Rancher, LRT/CTRS         Caroll Rancher A 10/16/2020 10:57 AM

## 2020-10-16 NOTE — Progress Notes (Signed)
Pt was very minimal and guarded with assessment questions. Pt will respond to most questions with "I don't know." He did report feeling depressed tonight and rated it a 7 on a scale of 0-10 (10 being the worse). He is not able to identify what coping mechanisms he has developed or things he would like to work on yet. He does report attending groups. He was present in the dayroom tonight and was observed interacting with his peers. He denies any dizziness or lightheadedness tonight. Pt is passively suicidal without a plan. He verbally contracts for safety. Pt agrees to notify staff immediately for any thoughts of hurting himself or anyone else. Pt denies HI and AVH. Active listening, reassurance, and support provided. Q 15 min safety checks continue. Pt's safety has been maintained.   10/16/20 2107  Psych Admission Type (Psych Patients Only)  Admission Status Voluntary  Psychosocial Assessment  Patient Complaints Anxiety;Depression;Sadness;Self-harm thoughts  Eye Contact Fair  Facial Expression Flat  Affect Depressed;Sad;Flat  Speech Logical/coherent  Interaction Guarded;Minimal;Forwards little  Motor Activity Other (Comment) (WNL)  Appearance/Hygiene Unremarkable  Behavior Characteristics Cooperative;Anxious;Appropriate to situation  Mood Depressed;Anxious;Sad  Thought Process  Coherency WDL  Content WDL  Delusions None reported or observed  Perception WDL  Hallucination None reported or observed  Judgment Poor  Confusion None  Danger to Self  Current suicidal ideation? Passive  Self-Injurious Behavior No self-injurious ideation or behavior indicators observed or expressed   Agreement Not to Harm Self Yes  Description of Agreement pt verbally agrees to approach staff immediately for any thoughts of hurting himself or anyone else  Danger to Others  Danger to Others None reported or observed

## 2020-10-16 NOTE — Progress Notes (Signed)
Adult Psychoeducational Group Note  Date:  10/16/2020 Time:  10:22 PM  Group Topic/Focus:  Wrap-Up Group:   The focus of this group is to help patients review their daily goal of treatment and discuss progress on daily workbooks.  Participation Level:  Active  Participation Quality:  Appropriate  Affect:  Appropriate  Cognitive:  Oriented  Insight: Appropriate  Engagement in Group:  Engaged  Modes of Intervention:  Education and Support  Additional Comments:  Patient attended and participated in group tonight. He reports that today was bearable for him he played basketball, watched movies and that he wanted to kill himself. We talked about contracting for safety. He agreed that he will talk to his nurse or someone if he is feeling like hurting himself.  Lita Mains [facility] 10/16/2020, 10:22 PM

## 2020-10-16 NOTE — [facility] Group Notes (Signed)
[facility] LCSW Group Therapy  10/16/2020 2:35 PM  Type of Therapy:  Gratitude   Participation Level:  Active  Participation Quality:  Appropriate  Affect:  Appropriate  Cognitive:  Appropriate  Insight:  Developing/Improving  Engagement in Therapy:  Developing/Improving  Modes of Intervention:  Activity and Discussion  Summary of Progress/Problems: Amil states that he is grateful for skateboarding, his frog named Sprite, and for his mother and ex-girlfriend who is now one of his best friends.  Fredrik states that he is also happy that he is not anyone but himself.  [place] accepted the handouts that were provided during the group.   [place]ung Josten [place] 10/16/2020, 2:35 PM

## 2020-10-16 NOTE — Progress Notes (Signed)
Participation Level: Active  Engagement in Group: The patient shared and was able to identify one goal for this hospitalization and three coping skills for depression/anxiety.   Type of Group: The focus of this group is to help patients establish daily goals and coping skills to achieve during treatment and discuss how the patient can incorporate goal and coping skills into their daily lives to aide in recovery. Patients received handouts on ways to cope with mental health disorders.    
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
## 2020-10-16 NOTE — Tx Team (Signed)
Interdisciplinary Treatment and Diagnostic Plan Update  10/16/2020 Time of Session: 9:10am  ELLIAS Schultz MRN: [id number]  Principal Diagnosis: <principal problem not specified>  Secondary Diagnoses: Active Problems:   Severe recurrent major depression without psychotic features ([place])   Current Medications:  Current Facility-Administered Medications  Medication Dose Route Frequency Provider Last Rate Last Admin  . acetaminophen (TYLENOL) tablet 650 mg  650 mg Oral Q6H PRN Lindon Romp A, NP      . alum & mag hydroxide-simeth (MAALOX/MYLANTA) 200-200-20 MG/5ML suspension 30 mL  30 mL Oral Q4H PRN Lindon Romp A, NP      . ARIPiprazole (ABILIFY) tablet 10 mg  10 mg Oral Daily Lindon Romp A, NP   10 mg at 10/16/20 0804  . hydrOXYzine (ATARAX/VISTARIL) tablet 25 mg  25 mg Oral TID PRN Rozetta Nunnery, NP   25 mg at 10/15/20 2107  . magnesium hydroxide (MILK OF MAGNESIA) suspension 30 mL  30 mL Oral Daily PRN Lindon Romp A, NP      . sertraline (ZOLOFT) tablet 150 mg  150 mg Oral Daily Lindon Romp A, NP   150 mg at 10/16/20 0804  . traZODone (DESYREL) tablet 50 mg  50 mg Oral QHS PRN Rozetta Nunnery, NP   50 mg at 10/15/20 2107   PTA Medications: Medications Prior to Admission  Medication Sig Dispense Refill Last Dose  . ARIPiprazole (ABILIFY) 5 MG tablet Take by mouth.     . sertraline (ZOLOFT) 100 MG tablet Take by mouth.       Patient Stressors: Network engineer difficulties Marital or family conflict Occupational concerns  Patient Strengths: Ability for insight Active sense of humor Average or above average intelligence Capable of independent living Curator fund of knowledge Motivation for treatment/growth Supportive family/friends  Treatment Modalities: Medication Management, Group therapy, Case management,  1 to 1 session with clinician, Psychoeducation, Recreational therapy.   Physician Treatment Plan for Primary Diagnosis:  <principal problem not specified> Long Term Goal(s):     Short Term Goals:    Medication Management: Evaluate patient's response, side effects, and tolerance of medication regimen.  Therapeutic Interventions: 1 to 1 sessions, Unit Group sessions and Medication administration.  Evaluation of Outcomes: Not Met  Physician Treatment Plan for Secondary Diagnosis: Active Problems:   Severe recurrent major depression without psychotic features ([place])  Long Term Goal(s):     Short Term Goals:       Medication Management: Evaluate patient's response, side effects, and tolerance of medication regimen.  Therapeutic Interventions: 1 to 1 sessions, Unit Group sessions and Medication administration.  Evaluation of Outcomes: Not Met   RN Treatment Plan for Primary Diagnosis: <principal problem not specified> Long Term Goal(s): Knowledge of disease and therapeutic regimen to maintain health will improve  Short Term Goals: Ability to remain free from injury will improve, Ability to demonstrate self-control, Ability to participate in decision making will improve, Ability to disclose and discuss suicidal ideas and Ability to identify and develop effective coping behaviors will improve  Medication Management: RN will administer medications as ordered by provider, will assess and evaluate patient's response and provide education to patient for prescribed medication. RN will report any adverse and/or side effects to prescribing provider.  Therapeutic Interventions: 1 on 1 counseling sessions, Psychoeducation, Medication administration, Evaluate responses to treatment, Monitor vital signs and CBGs as ordered, Perform/monitor CIWA, COWS, AIMS and Fall Risk screenings as ordered, Perform wound care treatments as ordered.  Evaluation of Outcomes: Not Met  LCSW Treatment Plan for Primary Diagnosis: <principal problem not specified> Long Term Goal(s): Safe transition to appropriate next level of care at  discharge, Engage patient in therapeutic group addressing interpersonal concerns.  Short Term Goals: Engage patient in aftercare planning with referrals and resources, Increase emotional regulation, Facilitate acceptance of mental health diagnosis and concerns, Facilitate patient progression through stages of change regarding substance use diagnoses and concerns and Increase skills for wellness and recovery  Therapeutic Interventions: Assess for all discharge needs, 1 to 1 time with Social worker, Explore available resources and support systems, Assess for adequacy in community support network, Educate family and significant other(s) on suicide prevention, Complete Psychosocial Assessment, Interpersonal group therapy.  Evaluation of Outcomes: Not Met   Progress in Treatment: Attending groups: No. Participating in groups: No. Taking medication as prescribed: Yes. Toleration medication: Yes. Family/Significant other contact made: No, will contact:  If consents are given  Patient understands diagnosis: No. Discussing patient identified problems/goals with staff: Yes. Medical problems stabilized or resolved: Yes. Denies suicidal/homicidal ideation: Yes. Issues/concerns per patient self-inventory: No.   New problem(s) identified: No, Describe:  None   New Short Term/Long Term Goal(s): medication stabilization, elimination of SI thoughts, development of comprehensive mental wellness plan.   Patient Goals:  "I don't have any goals"   Discharge Plan or Barriers: Patient recently admitted. CSW will continue to follow and assess for appropriate referrals and possible discharge planning.   Reason for Continuation of Hospitalization: Depression Medication stabilization Suicidal ideation  Estimated Length of Stay: 3 to 5 days   Attendees: Patient: Jesse Schultz  10/16/2020   Physician: Lala Lund, MD 10/16/2020   Nursing:  10/16/2020   RN Care Manager: 10/16/2020   Social Worker: Verdis Frederickson, [place]  10/16/2020   Recreational Therapist:  10/16/2020   Other:  10/16/2020   Other:  10/16/2020   Other: 10/16/2020     Scribe for Treatment Team: Darleen Crocker, [place] 10/16/2020 9:29 AM

## 2020-10-16 NOTE — Progress Notes (Signed)
   10/16/20 2107  COVID-19 Daily Checkoff  Have you had a fever (temp > 37.80C/100F)  in the past 24 hours?  No  COVID-19 EXPOSURE  Have you traveled outside the state in the past 14 days? No  Have you been in contact with someone with a confirmed diagnosis of COVID-19 or PUI in the past 14 days without wearing appropriate PPE? No  Have you been living in the same home as a person with confirmed diagnosis of COVID-19 or a PUI (household contact)? No  Have you been diagnosed with COVID-19? No

## 2020-10-16 NOTE — [facility] Suicide Risk Assessment (Signed)
[facility] INPATIENT:  Family/Significant Other Suicide Prevention Education  Suicide Prevention Education:  Education Completed; Aren Pryde [phone] (Mother) has been identified by the patient as the family member/significant other with whom the patient will be residing, and identified as the person(s) who will aid the patient in the event of a mental health crisis (suicidal ideations/suicide attempt).  With written consent from the patient, the family member/significant other has been provided the following suicide prevention education, prior to the and/or following the discharge of the patient.  The suicide prevention education provided includes the following:  Suicide risk factors  Suicide prevention and interventions  National Suicide Hotline telephone number  [facility] assessment telephone number  [facility] Emergency Assistance 911  [facility] and/or Residential Mobile Crisis Unit telephone number  Request made of family/significant other to:  Remove weapons (e.g., guns, rifles, knives), all items previously/currently identified as safety concern.    Remove drugs/medications (over-the-counter, prescriptions, illicit drugs), all items previously/currently identified as a safety concern.  The family member/significant other verbalizes understanding of the suicide prevention education information provided.  The family member/significant other agrees to remove the items of safety concern listed above.  Mrs. Kesecker states that her son has experienced highs and lows with depression since [employer] but states that for the last week her son has seemed more depressed then usual.  Mrs. Wiacek states that her son's psychiatrist increased one of his medications and did not feel comfortable changing the medications until her son could be seen by a doctor at the hospital.  Mrs. Elsbernd states that her son does not usually share things with anyone and that it is a  fight at home to get him to open up and talk about his thoughts and feelings.  Mrs. Mckee states that sometimes her son becomes angry when he is forced to talk about things.  Mrs. Lundin states that she and her husband are in the process of separating and filing for divorce and believes that this could be an emotional issue for her son.  Mrs. Olesen states that there are firearms and weapons in the home and that these items are locked away since her son's last suicide attempt.  Mrs. Hazelett states that her son can come home when he is discharged from the hospital.  CSW completed SPE with Mrs. Villalva.   Metro Kung Carsyn Boster 10/16/2020, 2:56 PM
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
## 2020-10-16 NOTE — Progress Notes (Signed)
Patient presents with a flat affect and mood is congruent. He has poor eye contact and forwards little information. When asked if he's depressed, he stated, "I don't know." He rated anxiety 7/10. He reported having constant suicidal ideations and expressed thinking about "when and where." He expressed "thinking about using a gun to do it." He reported feeling safe in the hospital and verbally contracts for safety. He denied HI. According to the sleep times the pt slept 6.5 hours last night. He was noted to be hypotensive this morning when standing. B/p reassessed and was 75/52 sitting. Patient reported feeling dizzy and lightheaded. He remained in bed and was given Gatorade. B/p reassessed 93/57 HR 73.  Dr. Renaldo Fiddler notified. Will continue to monitor. Fall risk education provided to the pt.   Orders reviewed. Vital signs reviewed. Verbal support provided. 15 minute checks performed for safety.   Pt compliant with treatment plan. He denies having any side effects to the medications.

## 2020-10-16 NOTE — [facility] Suicide Risk Assessment (Signed)
[facility] Admission Suicide Risk Assessment   Nursing information obtained from:  Patient Demographic factors:  Male,Living alone,Adolescent or young adult,Caucasian,Low socioeconomic status Current Mental Status:  Suicidal ideation indicated by patient,Self-harm thoughts Loss Factors:  Financial problems / change in socioeconomic status Historical Factors:  Prior suicide attempts,Impulsivity Risk Reduction Factors:  NA  Total Time spent with patient: 20 minutes Principal Problem: Severe recurrent major depression without psychotic features (HCC) Diagnosis:  Principal Problem:   Severe recurrent major depression without psychotic features ([facility]) Active Problems:   Suicidal ideation  Subjective Data: 20 year old male with history of MDD presenting with SI  Continued Clinical Symptoms:  Alcohol Use Disorder Identification Test Final Score (AUDIT): 3 The "Alcohol Use Disorders Identification Test", Guidelines for Use in Primary Care, Second Edition.  World [employer] writer [facility]). Score between 0-7:  no or low risk or alcohol related problems. Score between 8-15:  moderate risk of alcohol related problems. Score between 16-19:  high risk of alcohol related problems. Score 20 or above:  warrants further diagnostic evaluation for alcohol dependence and treatment.   CLINICAL FACTORS:   Depression:   Anhedonia  See H and P for mental status exam  COGNITIVE FEATURES THAT CONTRIBUTE TO RISK:  Closed-mindedness    SUICIDE RISK:   Moderate:  Frequent suicidal ideation with limited intensity, and duration, some specificity in terms of plans, no associated intent, good self-control, limited dysphoria/symptomatology, some risk factors present, and identifiable protective factors, including available and accessible social support.  PLAN OF CARE: Continue care on inpatient unit  I certify that inpatient services furnished can reasonably be expected to improve the patient's condition.   Clement Sayres, MD 10/16/2020, 1:48 PM

## 2020-10-17 NOTE — Progress Notes (Signed)
[facility] Group Notes:  (Nursing/MHT/Case Management/Adjunct)  Date:  10/17/2020  Time:  2015  Type of Therapy:  wrap up group  Participation Level:  Active  Participation Quality:  Appropriate, Attentive and Sharing  Affect:  Depressed, Resistant and flipid  Cognitive:  Appropriate  Insight:  Lacking  Engagement in Group:  Limited  Modes of Intervention:  Clarification, Education and Support  Summary of Progress/Problems: Positive thinking and positive change were discussed.    Marcille Buffy 10/17/2020, 8:39 PM

## 2020-10-17 NOTE — Progress Notes (Signed)
Pt endorses SI with no plan.  Pt contracts for safety on the unit.  Pt presents with flat affect and depressed mood.  RN established rapport with pt and assessed for needs and concerns.  Pt is guarded on approach and has stayed to himself for most of the shift thus far.  Pt remains safe on the unit with q 15 min checks in place.

## 2020-10-17 NOTE — Progress Notes (Addendum)
[facility] MD Progress Note  10/17/2020 7:11 AM Jesse Schultz  MRN:  [id number] Subjective:   Jesse Schultz is a 20 yr old male who presents with MDD, Recurrent, Severe with SI (stab self with knife). PPHx is significant for MDD, Recurrent, Severe, Anxiety, 3 previous suicide attempts.  He reports that he still has SI but is slightly improved because he no longer has any specific plan. He reports no HI or AVH. He reports that he had good sleep and that his appetite is good. When asked if he knew what caused his current episode he still reply's with "I don't know." He reports that he has not had any lightheadedness or dizziness like he did yesterday. Encouraged him to continue drinking plenty of fluids. When asked what he plans to do once discharged he reports wanting to go to school for Art specifically visual- Photography.   Principal Problem: Severe recurrent major depression without psychotic features (HCC) Diagnosis: Principal Problem:   Severe recurrent major depression without psychotic features (HCC) Active Problems:   Suicidal ideation  Total Time spent with patient: 15 minutes  Past Psychiatric History: MDD, Recurrent, Severe, Anxiety, 3 previous suicide attempts.  Past Medical History:  Past Medical History:  Diagnosis Date   Allergy    Anxiety    Asthma    Depression    Fainting spell    GERD (gastroesophageal reflux disease)     Past Surgical History:  Procedure Laterality Date   ADENOIDECTOMY AND MYRINGOTOMY WITH TUBE PLACEMENT     CIRCUMCISION     PECTUS BAR INSERTION PEDIATRIC  2021   TYMPANOPLASTY     TYMPANOSTOMY TUBE PLACEMENT     WISDOM TOOTH EXTRACTION     Family History:  Family History  Problem Relation Age of Onset   Allergies Mother    Asthma Maternal Grandmother    Allergies Maternal Grandmother    Heart disease Maternal Grandfather    Family Psychiatric  History: None Known Social History:  Social History   Substance and Sexual Activity  Alcohol  Use Yes   Alcohol/week: 3.0 standard drinks   Types: 3 Cans of beer per week   Comment: 3 cans beer every week     Social History   Substance and Sexual Activity  Drug Use No    Social History   Socioeconomic History   Marital status: Single    Spouse name: Not on file   Number of children: 0   Years of education: Not on file   Highest education level: High school graduate  Occupational History   Not on file  Tobacco Use   Smoking status: Never Smoker   Smokeless tobacco: Never Used  Vaping Use   Vaping Use: Never used  Substance and Sexual Activity   Alcohol use: Yes    Alcohol/week: 3.0 standard drinks    Types: 3 Cans of beer per week    Comment: 3 cans beer every week   Drug use: No   Sexual activity: Yes    Birth control/protection: None  Other Topics Concern   Not on file  Social History Narrative   Jesse Schultz is in ninth grade at [employer]. He is meeting most of the goals on his IEP.He struggles with math.   Jesse Schultz lives with his parents, and two brothers.   Social Determinants of Health   Financial Resource Strain: Not on file  Food Insecurity: Not on file  Transportation Needs: Not on file  Physical Activity: Not on file  Stress: Not on file  Social Connections: Not on file   Additional Social History:                         Sleep: Good  Appetite:  Good  Current Medications: Current Facility-Administered Medications  Medication Dose Route Frequency Provider Last Rate Last Admin   acetaminophen (TYLENOL) tablet 650 mg  650 mg Oral Q6H PRN Jackelyn Poling, NP       alum & mag hydroxide-simeth (MAALOX/MYLANTA) 200-200-20 MG/5ML suspension 30 mL  30 mL Oral Q4H PRN Nira Conn A, NP       ARIPiprazole (ABILIFY) tablet 10 mg  10 mg Oral Daily Nira Conn A, NP   10 mg at 10/16/20 0804   hydrOXYzine (ATARAX/VISTARIL) tablet 25 mg  25 mg Oral TID PRN Jackelyn Poling, NP   25 mg at 10/16/20 2106   magnesium hydroxide (MILK OF MAGNESIA)  suspension 30 mL  30 mL Oral Daily PRN Nira Conn A, NP       sertraline (ZOLOFT) tablet 150 mg  150 mg Oral Daily Nira Conn A, NP   150 mg at 10/16/20 0804   traZODone (DESYREL) tablet 50 mg  50 mg Oral QHS PRN Jackelyn Poling, NP   50 mg at 10/16/20 2107    Lab Results: No results found for this or any previous visit (from the past 48 hour(s)).  Blood Alcohol level:  Lab Results  Component Value Date   ETH 162 (H) 07/28/2020    Metabolic Disorder Labs: Lab Results  Component Value Date   HGBA1C 5.0 10/14/2020   MPG 96.8 10/14/2020   No results found for: PROLACTIN Lab Results  Component Value Date   CHOL 122 10/14/2020   TRIG 69 10/14/2020   HDL 45 10/14/2020   CHOLHDL 2.7 10/14/2020   VLDL 14 10/14/2020   LDLCALC 63 10/14/2020    Physical Findings: AIMS: Facial and Oral Movements Muscles of Facial Expression: None, normal Lips and Perioral Area: None, normal Jaw: None, normal Tongue: None, normal,Extremity Movements Upper (arms, wrists, hands, fingers): None, normal Lower (legs, knees, ankles, toes): None, normal, Trunk Movements Neck, shoulders, hips: None, normal, Overall Severity Severity of abnormal movements (highest score from questions above): None, normal Incapacitation due to abnormal movements: None, normal Patient's awareness of abnormal movements (rate only patient's report): No Awareness, Dental Status Current problems with teeth and/or dentures?: No Does patient usually wear dentures?: No  CIWA:  CIWA-Ar Total: 1 COWS:  COWS Total Score: 1  Musculoskeletal: Strength & Muscle Tone: within normal limits Gait & Station: normal Patient leans: N/A  Psychiatric Specialty Exam: Physical Exam Vitals and nursing note reviewed.  Constitutional:      General: He is not in acute distress.    Appearance: Normal appearance. He is normal weight. He is not ill-appearing, toxic-appearing or diaphoretic.  HENT:     Head: Normocephalic and atraumatic.   Cardiovascular:     Rate and Rhythm: Normal rate.  Pulmonary:     Effort: Pulmonary effort is normal.  Musculoskeletal:        General: Normal range of motion.  Neurological:     General: No focal deficit present.     Mental Status: He is alert.     Review of Systems  Constitutional: Negative for fatigue and fever.  Respiratory: Negative for chest tightness and shortness of breath.   Cardiovascular: Negative for chest pain and palpitations.  Gastrointestinal: Negative for abdominal pain,  constipation, diarrhea, nausea and vomiting.  Neurological: Negative for dizziness, weakness, light-headedness and headaches.  Psychiatric/Behavioral: Positive for dysphoric mood and suicidal ideas. Negative for agitation.    Blood pressure (!) 82/53, pulse 73, temperature (!) 97.5 F (36.4 C), temperature source Oral, resp. rate 18, SpO2 100 %.There is no height or weight on file to calculate BMI.  General Appearance: Casual  Eye Contact:  Fair  Speech:  Clear and Coherent and Slow  Volume:  Decreased  Mood:  Depressed  Affect:  Depressed and Flat  Thought Process:  Coherent  Orientation:  Full (Time, Place, and Person)  Thought Content:  Logical  Suicidal Thoughts:  Yes.  without intent/plan  Homicidal Thoughts:  No  Memory:  Immediate;   Good Recent;   Good  Judgement:  Poor  Insight:  Lacking  Psychomotor Activity:  Normal  Concentration:  Concentration: Fair and Attention Span: Fair  Recall:  Good  Fund of Knowledge:  Good  Language:  Good  Akathisia:  No  Handed:  Right  AIMS (if indicated):     Assets:  Resilience Social Support  ADL's:  Intact  Cognition:  WNL  Sleep:  Number of Hours: 6.5     Treatment Plan Summary: Daily contact with patient to assess and evaluate symptoms and progress in treatment  Mr. Sailors is a 20 yr old male who presents with MDD, Recurrent, Severe with SI (stab self with knife). PPHx is significant for MDD, Anxiety, 3 previous suicide  attempts. Will continue to monitor his recently increased Abilify dosage. Patient making some slow progress. Unclear to what extent his personality is playing into his reports of symptoms.    MDD, Recurrent, Severe  SI : -Continue Abilify 10 mg daily -Continue Zoloft 150 mg daily   -Continue PRN's: Tylenol, Maalox, Atarax, Milk of Magnesia, Trazodone   Lauro Franklin, MD 10/17/2020, 7:11 AM

## 2020-10-18 LAB — RESP PANEL BY RT-PCR (FLU A&B, COVID) ARPGX2
Influenza A by PCR: NEGATIVE
Influenza B by PCR: NEGATIVE
SARS Coronavirus 2 by RT PCR: NEGATIVE

## 2020-10-18 LAB — POC SARS CORONAVIRUS 2 AG: SARS Coronavirus 2 Ag: NEGATIVE

## 2020-10-18 NOTE — [facility] Group Notes (Signed)
Adult Psychoeducational Group Note  Date:  10/18/2020 Time:  9:02 AM  Group Topic/Focus:  Goals Group:   The focus of this group is to help patients establish daily goals to achieve during treatment and discuss how the patient can incorporate goal setting into their daily lives to aide in recovery.  Participation Level:  Did Not Attend Jesse Schultz J Jesse Schultz 10/18/2020, 9:02 AM 

## 2020-10-18 NOTE — Progress Notes (Addendum)
D:  Patient stated he does have SI thoughts after being discharged, no plans.  Does not have SI thoughts to hurt himself in the hospital.  Denied HI.  Denied A/V hallucinations.   Contracts for safety.    A:  Medications administered per MD orders.  Emotional support and encouragement given patient. R:  Safety maintained with 15 minute checks.

## 2020-10-18 NOTE — Progress Notes (Signed)
   10/18/20 2137  COVID-19 Daily Checkoff  Have you had a fever (temp > 37.80C/100F)  in the past 24 hours?  No  COVID-19 EXPOSURE  Have you traveled outside the state in the past 14 days? No  Have you been in contact with someone with a confirmed diagnosis of COVID-19 or PUI in the past 14 days without wearing appropriate PPE? No  Have you been living in the same home as a person with confirmed diagnosis of COVID-19 or a PUI (household contact)? No  Have you been diagnosed with COVID-19? No

## 2020-10-18 NOTE — Progress Notes (Signed)
   10/17/20 2117  COVID-19 Daily Checkoff  Have you had a fever (temp > 37.80C/100F)  in the past 24 hours?  No  COVID-19 EXPOSURE  Have you traveled outside the state in the past 14 days? No  Have you been in contact with someone with a confirmed diagnosis of COVID-19 or PUI in the past 14 days without wearing appropriate PPE? No  Have you been living in the same home as a person with confirmed diagnosis of COVID-19 or a PUI (household contact)? No  Have you been diagnosed with COVID-19? No

## 2020-10-18 NOTE — [facility] Group Notes (Signed)
[facility] LCSW Group Therapy  10/18/2020 1:27 PM  Type of Therapy:  Group Therapy: Self-Care: Outside  Participation Level:  Active  Summary of Progress/Problems: Pt attended group. Pt shared that he currently draws in order to participate in self care. Pt stated he wants to try bowling as a new form of self-care. Pt threw the football and socialized with peers and staff during his recreation time.  Felizardo Hoffmann 10/18/2020, 1:27 PM

## 2020-10-18 NOTE — Progress Notes (Signed)
Recreation Therapy Notes  Date:  2.11.22 Time: 1005 Location: 500 Hall Dayroom  Group Topic: Stress Management  Goal Area(s) Addresses:  Patient will identify positive stress management techniques. Patient will identify benefits of using stress management post d/c.  Behavioral Response: Engaged  Intervention: Stress Management  Activity: Meditation.  LRT played a meditation on being resilient in the face of adversity.  Patients were to listen as the meditation played to engage in the activity.    Education:  Stress Management, Discharge Planning.   Education Outcome: Acknowledges Education  Clinical Observations/Feedback: Pt attended and participated in group session.       Caroll Rancher, LRT/CTRS     Lillia Abed, Maurico Perrell A 10/18/2020 11:06 AM

## 2020-10-18 NOTE — Progress Notes (Addendum)
[facility] MD Progress Note  10/18/2020 7:07 AM Jesse Schultz  MRN:  [id number] Subjective:   Jesse Schultz is a 20 yr old male who presents with MDD, Recurrent, Severe with SI (stab self with knife). PPHx is significant for MDD, Recurrent, Severe, Anxiety, 3 previous suicide attempts.  He reports that he has been sleeping ok and his appetite is ok. He reports that his SI is improved. He reports no HI or AVH. When asked what he thinks of the groups he shrugs his shoulders. When asked if he has had any side effects from the medication changes he reports no.   Attempted to contact his outpatient provider Dr. Jannifer Franklin but was not able to get in touch with him.  Principal Problem: Severe recurrent major depression without psychotic features (HCC) Diagnosis: Principal Problem:   Severe recurrent major depression without psychotic features (HCC) Active Problems:   Suicidal ideation  Total Time spent with patient: 15 minutes  Past Psychiatric History: MDD, Recurrent, Severe, Anxiety, 3 previous suicide attempts.  Past Medical History:  Past Medical History:  Diagnosis Date  . Allergy   . Anxiety   . Asthma   . Depression   . Fainting spell   . GERD (gastroesophageal reflux disease)     Past Surgical History:  Procedure Laterality Date  . ADENOIDECTOMY AND MYRINGOTOMY WITH TUBE PLACEMENT    . CIRCUMCISION    . PECTUS BAR INSERTIONPEDIATRIC  2021  . TYMPANOPLASTY    . TYMPANOSTOMY TUBE PLACEMENT    . WISDOM TOOTH EXTRACTION     Family History:  Family History  Problem Relation Age of Onset  . Allergies Mother   . Asthma Maternal Grandmother   . Allergies Maternal Grandmother   . Heart disease Maternal Grandfather    Family Psychiatric  History: None Known Social History:  Social History   Substance and Sexual Activity  Alcohol Use Yes  . Alcohol/week: 3.0 standard drinks  . Types: 3 Cans of beer per week   Comment: 3 cans beer every week     Social History   Substance  and Sexual Activity  Drug Use No    Social History   Socioeconomic History  . Marital status: Single    Spouse name: Not on file  . Number of children: 0  . Years of education: Not on file  . Highest education level: High school graduate  Occupational History  . Not on file  Tobacco Use  . Smoking status: Never Smoker  . Smokeless tobacco: Never Used  Vaping Use  . Vaping Use: Never used  Substance and Sexual Activity  . Alcohol use: Yes    Alcohol/week: 3.0 standard drinks    Types: 3 Cans of beer per week    Comment: 3 cans beer every week  . Drug use: No  . Sexual activity: Yes    Birth control/protection: None  Other Topics Concern  . Not on file  Social History Narrative   Steven is in ninth grade at [employer]. He is meeting most of the goals on his IEP.He struggles with math.   Adarrius lives with his parents, and two brothers.   Social Determinants of Health   Financial Resource Strain: Not on file  Food Insecurity: Not on file  Transportation Needs: Not on file  Physical Activity: Not on file  Stress: Not on file  Social Connections: Not on file   Additional Social History:  Sleep: Good  Appetite:  Good  Current Medications: Current Facility-Administered Medications  Medication Dose Route Frequency Provider Last Rate Last Admin  . acetaminophen (TYLENOL) tablet 650 mg  650 mg Oral Q6H PRN Nira Conn A, NP      . alum & mag hydroxide-simeth (MAALOX/MYLANTA) 200-200-20 MG/5ML suspension 30 mL  30 mL Oral Q4H PRN Nira Conn A, NP      . ARIPiprazole (ABILIFY) tablet 10 mg  10 mg Oral Daily Nira Conn A, NP   10 mg at 10/17/20 0820  . hydrOXYzine (ATARAX/VISTARIL) tablet 25 mg  25 mg Oral TID PRN Jackelyn Poling, NP   25 mg at 10/17/20 2117  . magnesium hydroxide (MILK OF MAGNESIA) suspension 30 mL  30 mL Oral Daily PRN Nira Conn A, NP      . sertraline (ZOLOFT) tablet 150 mg  150 mg Oral Daily Nira Conn  A, NP   150 mg at 10/17/20 0820  . traZODone (DESYREL) tablet 50 mg  50 mg Oral QHS PRN Jackelyn Poling, NP   50 mg at 10/16/20 2107    Lab Results: No results found for this or any previous visit (from the past 48 hour(s)).  Blood Alcohol level:  Lab Results  Component Value Date   ETH 162 (H) 07/28/2020    Metabolic Disorder Labs: Lab Results  Component Value Date   HGBA1C 5.0 10/14/2020   MPG 96.8 10/14/2020   No results found for: PROLACTIN Lab Results  Component Value Date   CHOL 122 10/14/2020   TRIG 69 10/14/2020   HDL 45 10/14/2020   CHOLHDL 2.7 10/14/2020   VLDL 14 10/14/2020   LDLCALC 63 10/14/2020    Physical Findings: AIMS: Facial and Oral Movements Muscles of Facial Expression: None, normal Lips and Perioral Area: None, normal Jaw: None, normal Tongue: None, normal,Extremity Movements Upper (arms, wrists, hands, fingers): None, normal Lower (legs, knees, ankles, toes): None, normal, Trunk Movements Neck, shoulders, hips: None, normal, Overall Severity Severity of abnormal movements (highest score from questions above): None, normal Incapacitation due to abnormal movements: None, normal Patient's awareness of abnormal movements (rate only patient's report): No Awareness, Dental Status Current problems with teeth and/or dentures?: No Does patient usually wear dentures?: No  CIWA:  CIWA-Ar Total: 1 COWS:  COWS Total Score: 1  Musculoskeletal: Strength & Muscle Tone: within normal limits Gait & Station: normal Patient leans: N/A  Psychiatric Specialty Exam: Physical Exam Vitals and nursing note reviewed.  Constitutional:      General: He is not in acute distress.    Appearance: Normal appearance. He is normal weight. He is not ill-appearing, toxic-appearing or diaphoretic.  HENT:     Head: Normocephalic and atraumatic.  Cardiovascular:     Rate and Rhythm: Normal rate.  Pulmonary:     Effort: Pulmonary effort is normal.  Musculoskeletal:         General: Normal range of motion.  Neurological:     General: No focal deficit present.     Mental Status: He is alert.     Review of Systems  Constitutional: Negative for fatigue and fever.  Respiratory: Negative for chest tightness and shortness of breath.   Cardiovascular: Negative for chest pain and palpitations.  Gastrointestinal: Negative for abdominal pain, constipation, diarrhea, nausea and vomiting.  Neurological: Negative for dizziness, weakness, light-headedness and headaches.  Psychiatric/Behavioral: Positive for suicidal ideas (no plan).    Blood pressure 112/72, pulse 72, temperature (!) 97.5 F (36.4 C), temperature source Oral,  resp. rate 18, SpO2 100 %.There is no height or weight on file to calculate BMI.  General Appearance: Casual  Eye Contact:  Fair  Speech:  Clear and Coherent and Normal Rate  Volume:  Normal  Mood:  Dysphoric  Affect:  Flat  Thought Process:  Coherent  Orientation:  Full (Time, Place, and Person)  Thought Content:  Logical  Suicidal Thoughts:  Yes.  without intent/plan  Homicidal Thoughts:  No  Memory:  Immediate;   Fair Recent;   Fair  Judgement:  Fair  Insight:  Fair  Psychomotor Activity:  Normal  Concentration:  Concentration: Fair and Attention Span: Fair  Recall:  Good  Fund of Knowledge:  Good  Language:  Good  Akathisia:  Negative  Handed:  Right  AIMS (if indicated):     Assets:  Resilience  ADL's:  Intact  Cognition:  WNL  Sleep:  Number of Hours: 5.75     Treatment Plan Summary: Daily contact with patient to assess and evaluate symptoms and progress in treatment  Jesse Schultz is a 20 yr old male who presents with MDD, Recurrent, Severe with SI (stab self with knife). PPHx is significant for MDD, Recurrent, Severe, Anxiety, 3 previous suicide attempts.  He is out and interacting on the unit. At this point it appears that his issues are mainly personality driven. He has tolerated the medication changes well. Will plan  for discharge Monday.    MDD, Recurrent, Severe  SI : -Continue Abilify 10 mg daily -Continue Zoloft 150 mg daily  -Continue PRN's: Tylenol, Maalox, Atarax, Milk of Magnesia, Trazodone   Lauro Franklin, MD 10/18/2020, 7:07 AM
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
## 2020-10-19 NOTE — Progress Notes (Signed)
Pt was asked about goals he has set for himself and coping skills he has developed. Pt feels like he has not learned anything from group that he can apply to himself. He thinks that there is "nothing" for him to work on. Pt is minimal and forwards little. He is interactive in the dayroom with his peers though. This Clinical research associate gave the pt an assignment to develop and write down 5 goals in his blue journal. Pt is passively suicidal without a plan and verbally contracts for safety. He agrees to notify staff immediately for any thoughts of hurting himself or anyone else. Pt denies HI and AVH. Active listening, reassurance, and support provided. Medications administered as ordered by provider. Q 15 min safety checks continue. Pt's safety has been maintained.   10/18/20 2134  Psych Admission Type (Psych Patients Only)  Admission Status Voluntary  Psychosocial Assessment  Patient Complaints Anxiety;Depression;Sadness;Hopelessness  Eye Contact Fair  Facial Expression Sad;Flat;Worried  Affect Depressed;Flat;Sad  Speech Logical/coherent  Interaction Minimal;Guarded  Motor Activity Fidgety  Appearance/Hygiene Unremarkable  Behavior Characteristics Cooperative;Anxious;Guarded  Mood Depressed;Anxious;Sad  Thought Process  Coherency WDL  Content WDL  Delusions None reported or observed  Perception WDL  Hallucination None reported or observed  Judgment Poor  Confusion None  Danger to Self  Current suicidal ideation? Passive  Self-Injurious Behavior No self-injurious ideation or behavior indicators observed or expressed   Agreement Not to Harm Self Yes  Description of Agreement pt verbally agrees to approach staff immediately for any thoughts of hurting himself or anyone else  Danger to Others  Danger to Others None reported or observed

## 2020-10-19 NOTE — Progress Notes (Signed)
[facility] Group Notes:  (Nursing/MHT/Case Management/Adjunct)  Date:  10/19/2020  Time:  2030 Type of Therapy:  wrap up group  Participation Level:  Active  Participation Quality:  Appropriate, Attentive, Sharing and Supportive  Affect:  Appropriate  Cognitive:  Appropriate  Insight:  Improving  Engagement in Group:  Engaged  Modes of Intervention:  Clarification, Education and Support  Summary of Progress/Problems: Positive thinking and self-care were discussed.   Marcille Buffy 10/19/2020, 9:14 PM

## 2020-10-19 NOTE — Progress Notes (Signed)
[facility] AA group

## 2020-10-19 NOTE — [facility] Group Notes (Signed)
.  Psychoeducational Group Note  Date: 10-19-2020 Time: 0900-1000    Goal Setting   Purpose of Group: This group helps to provide patients with the steps of setting a goal that is specific, measurable, attainable, realistic and time specific. A discussion on how we keep ourselves stuck with negative self talk.    Participation Level:  Active  Participation Quality:  Appropriate  Affect:  Appropriate  Cognitive:  Appropriate  Insight:  Improving  Engagement in Group:  Engaged  Additional Comments:  Pt rates his energy at a 3/10. States he has no goals except to die. Believes there is nothing for him at all. States "I don't have any friends and no one that cares. I would rather be dead"  Dione Housekeeper

## 2020-10-19 NOTE — Progress Notes (Addendum)
D: Patient is alert and oriented. Patient rates depression 7/10, hopelessness 9/10, and anxiety 4/10. Patient stated goal is to have more goals. Patient reports energy level as low and concentration as good. Patient reports slept good last night. Patient did receive medication for sleep and did find it was effective. Denies physical pain. Verbalizes he is having thoughts of suicide today, throughout the day. Verbally contracts for safety.   A: Noted interacting and laughing with peers in the milieu.Scheduled medications administered to patient per MD orders. Reassurance, support and encouragement provided. Routine unit safety checks conducted Q 15 minutes.   R: Patient adhered to medication administration. No adverse drug reactions noted. Interacts well with others in milieu. Remains safe at this time, will continue to monitor.   [place] NOVEL CORONAVIRUS (COVID-19) DAILY CHECK-OFF SYMPTOMS - answer yes or no to each - every day NO YES  Have you had a fever in the past 24 hours?   Fever (Temp > 37.80C / 100F) X    Have you had any of these symptoms in the past 24 hours?  New Cough   Sore Throat    Shortness of Breath   Difficulty Breathing   Unexplained Body Aches   X    Have you had any one of these symptoms in the past 24 hours not related to allergies?    Runny Nose   Nasal Congestion   Sneezing   X    If you have had runny nose, nasal congestion, sneezing in the past 24 hours, has it worsened?   X    EXPOSURES - check yes or no X    Have you traveled outside the state in the past 14 days?   X    Have you been in contact with someone with a confirmed diagnosis of COVID-19 or PUI in the past 14 days without wearing appropriate PPE?   X    Have you been living in the same home as a person with confirmed diagnosis of COVID-19 or a PUI (household contact)?     X    Have you been diagnosed with COVID-19?     X                                                                                                                              What to do next: Answered NO to all: Answered YES to anything:    Proceed with unit schedule Follow the [facility] Inpatient Flowsheet.

## 2020-10-19 NOTE — [facility] Group Notes (Signed)
Psychoeducational Group Note    Date:10/19/2020 Time: 1300-1400    Life Skills:  A group where two lists are made. What people need and what are things that we do that are healthy. The lists are developed by the patients and it is explained that we often do the actions that are not healthy to get our list of needs met.   Purpose of Group: . The group focus' on teaching patients on how to identify their needs and how to develop the coping skills needed to get their needs met  Participation Level:  Active  Participation Quality:  Appropriate  Affect:  Appropriate  Cognitive:  Oriented  Insight:  Improving  Engagement in Group:  Engaged  Additional Comments: Rates his energy at a 5. Contributed to the developing lists and was involved in the conversations.  Jesse Schultz

## 2020-10-19 NOTE — [facility] Group Notes (Signed)
LCSW Group Therapy Note  10/19/2020   10:00-11:00am   Type of Therapy and Topic:  Group Therapy: Anger Cues and Responses  Participation Level:  Active   Description of Group:   In this group, patients learned how to recognize the physical, cognitive, emotional, and behavioral responses they have to anger-provoking situations.  They identified a recent time they became angry and how they reacted.  They analyzed how their reaction was possibly beneficial and how it was possibly unhelpful.  The group discussed a variety of healthier coping skills that could help with such a situation in the future.  Focus was placed on how helpful it is to recognize the underlying emotions to our anger, because working on those can lead to a more permanent solution as well as our ability to focus on the important rather than the urgent.  Therapeutic Goals: 1. Patients will remember their last incident of anger and how they felt emotionally and physically, what their thoughts were at the time, and how they behaved. 2. Patients will identify how their behavior at that time worked for them, as well as how it worked against them. 3. Patients will explore possible new behaviors to use in future anger situations. 4. Patients will learn that anger itself is normal and cannot be eliminated, and that healthier reactions can assist with resolving conflict rather than worsening situations.  Summary of Patient Progress:  The patient shared that his most recent time of anger was last night when another patient stayed on the phone too long and said he did not want to interrupt the person, but the person had previously rudely interrupted the person before them.  He stated the way he handled it was to pretend there was a laugh track running and he was part of a Dorene Sorrow Seinfeld episode.  He does that frequently, and it worked this time as well.  He was insightful and made excellent observations and contributions to the group  discussion.  Therapeutic Modalities:   Cognitive Behavioral Therapy  Lynnell Chad

## 2020-10-19 NOTE — Progress Notes (Signed)
[facility] MD Progress Note  10/19/2020 12:32 PM Jesse Schultz  MRN:  [id number] Subjective:   Jesse Schultz is a 20 yr old male who presents with MDD, Recurrent, Severe with SI (stab self with knife). PPHx is significant for MDD, Recurrent, Severe, Anxiety, 3 previous suicide attempts.  He reports that he has had some positive improvement since yesterday. He reports that he does no have SI. He reports he continues to not have HI or AVH. He reports that he slept well and that his appetite has been good. He reports doing well in group therapy. He reports that he has been interacting well with other patients on the unit. He has no other concerns at present.   Principal Problem: Severe recurrent major depression without psychotic features (HCC) Diagnosis: Principal Problem:   Severe recurrent major depression without psychotic features (HCC) Active Problems:   Suicidal ideation  Total Time spent with patient: 15 minutes  Past Psychiatric History: MDD, Recurrent, Severe, Anxiety, 3 previous suicide attempts.  Past Medical History:  Past Medical History:  Diagnosis Date  . Allergy   . Anxiety   . Asthma   . Depression   . Fainting spell   . GERD (gastroesophageal reflux disease)     Past Surgical History:  Procedure Laterality Date  . ADENOIDECTOMY AND MYRINGOTOMY WITH TUBE PLACEMENT    . CIRCUMCISION    . PECTUS BAR INSERTIONPEDIATRIC  2021  . TYMPANOPLASTY    . TYMPANOSTOMY TUBE PLACEMENT    . WISDOM TOOTH EXTRACTION     Family History:  Family History  Problem Relation Age of Onset  . Allergies Mother   . Asthma Maternal Grandmother   . Allergies Maternal Grandmother   . Heart disease Maternal Grandfather    Family Psychiatric  History: None Known Social History:  Social History   Substance and Sexual Activity  Alcohol Use Yes  . Alcohol/week: 3.0 standard drinks  . Types: 3 Cans of beer per week   Comment: 3 cans beer every week     Social History   Substance and  Sexual Activity  Drug Use No    Social History   Socioeconomic History  . Marital status: Single    Spouse name: Not on file  . Number of children: 0  . Years of education: Not on file  . Highest education level: High school graduate  Occupational History  . Not on file  Tobacco Use  . Smoking status: Never Smoker  . Smokeless tobacco: Never Used  Vaping Use  . Vaping Use: Never used  Substance and Sexual Activity  . Alcohol use: Yes    Alcohol/week: 3.0 standard drinks    Types: 3 Cans of beer per week    Comment: 3 cans beer every week  . Drug use: No  . Sexual activity: Yes    Birth control/protection: None  Other Topics Concern  . Not on file  Social History Narrative   Jesse Schultz is in ninth grade at [employer]. He is meeting most of the goals on his IEP.He struggles with math.   Jesse Schultz lives with his parents, and two brothers.   Social Determinants of Health   Financial Resource Strain: Not on file  Food Insecurity: Not on file  Transportation Needs: Not on file  Physical Activity: Not on file  Stress: Not on file  Social Connections: Not on file   Additional Social History:  Sleep: Fair  Appetite:  Fair  Current Medications: Current Facility-Administered Medications  Medication Dose Route Frequency Provider Last Rate Last Admin  . acetaminophen (TYLENOL) tablet 650 mg  650 mg Oral Q6H PRN Nira Conn A, NP      . alum & mag hydroxide-simeth (MAALOX/MYLANTA) 200-200-20 MG/5ML suspension 30 mL  30 mL Oral Q4H PRN Nira Conn A, NP      . ARIPiprazole (ABILIFY) tablet 10 mg  10 mg Oral Daily Nira Conn A, NP   10 mg at 10/19/20 0955  . hydrOXYzine (ATARAX/VISTARIL) tablet 25 mg  25 mg Oral TID PRN Jackelyn Poling, NP   25 mg at 10/18/20 2137  . magnesium hydroxide (MILK OF MAGNESIA) suspension 30 mL  30 mL Oral Daily PRN Nira Conn A, NP      . sertraline (ZOLOFT) tablet 150 mg  150 mg Oral Daily Nira Conn A, NP    150 mg at 10/19/20 0955  . traZODone (DESYREL) tablet 50 mg  50 mg Oral QHS PRN Jackelyn Poling, NP   50 mg at 10/18/20 2137    Lab Results:  Results for orders placed or performed during the hospital encounter of 10/15/20 (from the past 48 hour(s))  POC SARS Coronavirus 2 Ag     Status: None   Collection Time: 10/18/20 10:36 AM  Result Value Ref Range   SARS Coronavirus 2 Ag NEGATIVE NEGATIVE    Comment: (NOTE) SARS-CoV-2 antigen NOT DETECTED.   Negative results are presumptive.  Negative results do not preclude SARS-CoV-2 infection and should not be used as the sole basis for treatment or other patient management decisions, including infection  control decisions, particularly in the presence of clinical signs and  symptoms consistent with COVID-19, or in those who have been in contact with the virus.  Negative results must be combined with clinical observations, patient history, and epidemiological information. The expected result is Negative.  Fact Sheet for Patients: https://www.jennings-kim.com/  Fact Sheet for Healthcare Providers: https://-rogers.biz/  This test is not yet approved or cleared by the [place] FDA and  has been authorized for detection and/or diagnosis of SARS-CoV-2 by FDA under an Emergency Use Authorization (EUA).  This EUA will remain in effect (meaning this test can be used) for the duration of  the COV ID-19 declaration under Section 564(b)(1) of the Act, 21 U.S.C. section 360bbb-3(b)(1), unless the authorization is terminated or revoked sooner.    Resp Panel by RT-PCR (Flu A&B, Covid) Nasopharyngeal Swab     Status: None   Collection Time: 10/18/20  3:47 PM   Specimen: Nasopharyngeal Swab; Nasopharyngeal(NP) swabs in vial transport medium  Result Value Ref Range   SARS Coronavirus 2 by RT PCR NEGATIVE NEGATIVE    Comment: (NOTE) SARS-CoV-2 target nucleic acids are NOT DETECTED.  The SARS-CoV-2 RNA is  generally detectable in upper respiratory specimens during the acute phase of infection. The lowest concentration of SARS-CoV-2 viral copies this assay can detect is 138 copies/mL. A negative result does not preclude SARS-Cov-2 infection and should not be used as the sole basis for treatment or other patient management decisions. A negative result may occur with  improper specimen collection/handling, submission of specimen other than nasopharyngeal swab, presence of viral mutation(s) within the areas targeted by this assay, and inadequate number of viral copies(<138 copies/mL). A negative result must be combined with clinical observations, patient history, and epidemiological information. The expected result is Negative.  Fact Sheet for Patients:  BloggerCourse.com  Fact Sheet  for Healthcare Providers:  SeriousBroker.ithttps://www.fda.gov/media/152162/download  This test is no t yet approved or cleared by the [place] FDA and  has been authorized for detection and/or diagnosis of SARS-CoV-2 by FDA under an Emergency Use Authorization (EUA). This EUA will remain  in effect (meaning this test can be used) for the duration of the COVID-19 declaration under Section 564(b)(1) of the Act, 21 U.S.C.section 360bbb-3(b)(1), unless the authorization is terminated  or revoked sooner.       Influenza A by PCR NEGATIVE NEGATIVE   Influenza B by PCR NEGATIVE NEGATIVE    Comment: (NOTE) The Xpert Xpress SARS-CoV-2/FLU/RSV plus assay is intended as an aid in the diagnosis of influenza from Nasopharyngeal swab specimens and should not be used as a sole basis for treatment. Nasal washings and aspirates are unacceptable for Xpert Xpress SARS-CoV-2/FLU/RSV testing.  Fact Sheet for Patients: BloggerCourse.comhttps://www.fda.gov/media/152166/download  Fact Sheet for Healthcare Providers: SeriousBroker.ithttps://www.fda.gov/media/152162/download  This test is not yet approved or cleared by the [place] FDA  and has been authorized for detection and/or diagnosis of SARS-CoV-2 by FDA under an Emergency Use Authorization (EUA). This EUA will remain in effect (meaning this test can be used) for the duration of the COVID-19 declaration under Section 564(b)(1) of the Act, 21 U.S.C. section 360bbb-3(b)(1), unless the authorization is terminated or revoked.  Performed at [facility], [address] [place], [place] [address]     Blood Alcohol level:  Lab Results  Component Value Date   ETH 162 (H) 07/28/2020    Metabolic Disorder Labs: Lab Results  Component Value Date   HGBA1C 5.0 10/14/2020   MPG 96.8 10/14/2020   No results found for: PROLACTIN Lab Results  Component Value Date   CHOL 122 10/14/2020   TRIG 69 10/14/2020   HDL 45 10/14/2020   CHOLHDL 2.7 10/14/2020   VLDL 14 10/14/2020   LDLCALC 63 10/14/2020    Physical Findings: AIMS: Facial and Oral Movements Muscles of Facial Expression: None, normal Lips and Perioral Area: None, normal Jaw: None, normal Tongue: None, normal,Extremity Movements Upper (arms, wrists, hands, fingers): None, normal Lower (legs, knees, ankles, toes): None, normal, Trunk Movements Neck, shoulders, hips: None, normal, Overall Severity Severity of abnormal movements (highest score from questions above): None, normal Incapacitation due to abnormal movements: None, normal Patient's awareness of abnormal movements (rate only patient's report): No Awareness, Dental Status Current problems with teeth and/or dentures?: No Does patient usually wear dentures?: No  CIWA:  CIWA-Ar Total: 1 COWS:  COWS Total Score: 1  Musculoskeletal: Strength & Muscle Tone: within normal limits Gait & Station: normal Patient leans: N/A  Psychiatric Specialty Exam: Physical Exam Vitals and nursing note reviewed.  Constitutional:      General: He is not in acute distress.    Appearance: Normal appearance. He is normal weight. He is not  ill-appearing, toxic-appearing or diaphoretic.  HENT:     Head: Normocephalic and atraumatic.  Cardiovascular:     Rate and Rhythm: Normal rate.  Pulmonary:     Effort: Pulmonary effort is normal.  Musculoskeletal:        General: Normal range of motion.  Neurological:     General: No focal deficit present.     Mental Status: He is alert.     Review of Systems  Constitutional: Negative for fatigue and fever.  Respiratory: Negative for chest tightness and shortness of breath.   Cardiovascular: Negative for chest pain and palpitations.  Gastrointestinal: Negative for abdominal pain, constipation, diarrhea, nausea  and vomiting.  Neurological: Negative for dizziness, weakness, light-headedness and headaches.  Psychiatric/Behavioral: Negative for suicidal ideas.    Blood pressure (!) 91/51, pulse 72, temperature 97.7 F (36.5 C), temperature source Oral, resp. rate 16, SpO2 99 %.There is no height or weight on file to calculate BMI.  General Appearance: Casual  Eye Contact:  Fair  Speech:  Clear and Coherent and Normal Rate  Volume:  Normal  Mood:  Dysphoric  Affect:  Flat  Thought Process:  Coherent  Orientation:  Full (Time, Place, and Person)  Thought Content:  Logical  Suicidal Thoughts:  No  Homicidal Thoughts:  No  Memory:  Immediate;   Good Recent;   Good  Judgement:  Poor  Insight:  Shallow  Psychomotor Activity:  Normal  Concentration:  Concentration: Fair and Attention Span: Fair  Recall:  [employer] of Knowledge:  Good  Language:  Good  Akathisia:  Negative  Handed:  Right  AIMS (if indicated):     Assets:  Resilience Social Support  ADL's:  Intact  Cognition:  WNL  Sleep:  Number of Hours: 6.75     Treatment Plan Summary: Daily contact with patient to assess and evaluate symptoms and progress in treatment  Mr. Schoene is a 20 yr old male who presents with MDD, Recurrent, Severe with SI (stab self with knife). PPHx is significant for MDD, Recurrent,  Severe, Anxiety, 3 previous suicide attempts.   Reports no thoughts of SI today. Appears that his medication changes have started to take effect. Will not make any medication changes today. Will plan for discharge Monday so that close follow up can be assured. Will continue to monitor.   MDD, Recurrent, Severe  SI : -Continue Abilify 10 mg daily -Continue Zoloft 150 mg daily   -Continue PRN's: Tylenol, Maalox, Atarax, Milk of Magnesia, Trazodone   Estimated Length of stay: 3 days  Lauro Franklin, MD 10/19/2020, 12:32 PM
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
## 2020-10-20 NOTE — Progress Notes (Signed)
[facility] Group Notes:  (Nursing/MHT/Case Management/Adjunct)  Date:  10/20/2020  Time: 2030 Type of Therapy:  wrap up group  Participation Level:  Active  Participation Quality:  Appropriate, Attentive, Sharing and Supportive  Affect:  Appropriate  Cognitive:  Appropriate  Insight:  Improving  Engagement in Group:  Engaged  Modes of Intervention:  Clarification, Education and Support  Summary of Progress/Problems: Positive thinking and positive change were discussed.   Marcille Buffy 10/20/2020, 9:24 PM

## 2020-10-20 NOTE — [facility] Group Notes (Signed)
Psychoeducational Group Note  Date: 10-20-20 Time:  1300  Group Topic/Focus:  Making Healthy Choices:   The focus of this group is to help patients identify negative/unhealthy choices they were using prior to admission and identify positive/healthier coping strategies to replace them upon discharge.In this group, patients started asking about the brain and how the brain works with and how the chemicals work for those who use substances, the pros and cons of saboxone.  Participation Level:  Active  Participation Quality:  Appropriate  Affect:  Appropriate  Cognitive:  Oriented  Insight:  Improving  Engagement in Group:  Engaged  Additional Comments: Pt rates his energy at a 7.  States that he is feeling better overall. Partcipated  In  the group discussion.    Dione Housekeeper

## 2020-10-20 NOTE — Progress Notes (Signed)
   10/20/20 1950  Precautions / Armbands  Precautions None  Patient armbands applied: Patient Identification (White)  [facility] Fall Risk Assessment  Age 20  Mental Status 0  Physical Satus 0  Elimination 0  Sensory Impairments 0  Gait or Balance 0  History or falls in past 6 months 0  Mood Stabilizer Medications 2  Benzodiazepines 2  Narcotics 2  Sedatives/Hypnotics 2  Atypical Anti Psychotics 2  Detox Protocol (Alcohol, Narcotics, etc.) 0  Total Score 10  Patient Fall Risk Level High fall risk  Required Bundle Interventions *See Row Information* High fall risk - low and high requirements implemented  Screening for Fall Injury Risk (To be completed on HIGH fall risk patients) - Assessing Need for Low Bed  Risk For Fall Injury- Low Bed Criteria None identified - Continue screening  Safe Patient Handling Required Documentation-Repositioning Needs  Assist with movement in bed No  Fragile skin with pressure ulcer No  Unresponsive No  Safe Patient Handling Assessment  Ambulates independently Yes, no lift needed

## 2020-10-20 NOTE — [facility] Group Notes (Signed)
[facility] LCSW Group Therapy Note  10/20/2020    Type of Therapy and Topic:  Group Therapy:  Adding Supports Including Yourself  Participation Level:  Active   Description of Group:   Patients in this group were introduced to the concept that additional supports including self-support are an essential part of recovery.  Patients listed their current healthy and unhealthy supports, and discussed what is the difference between the two.   A song entitled "My Own Hero" was played and a group discussion ensued in which patients stated they could relate to the song and it inspired them to realize they have be willing to help themselves in order to succeed, because other people cannot achieve sobriety or stability for them.  Additional songs were played ("Fight For It" then "I Am Enough") to encourage patients toward self-advocacy and self-support as part of their recovery.  They discussed their reactions to these songs' messages, which were positive and hopeful.  Before group ended, they identified the supports they believe they need to add to their lives to achieve their goals at discharge.   Therapeutic Goals: 1)  explain the difference between healthy and unhealthy supports and discuss what specific supports are currently in patients' lives 2)  demonstrate the importance of being a key part of one's own support system 3)  discuss the need for appropriate boundaries with supports 4)  elicit ideas from patients about supports that need to be added in order to achieve goals   Summary of Patient Progress:   The patient listed current healthy supports as his mother, his 2 best friends, and his frog and current unhealthy supports as his father who does not understand mental health issues.  We discussed various ways to educate both himself and his father about his mental health issues.  Prior to the end of group, patient expressed that supports he needs to add at discharge include a therapist who can help him work  on self-forgiveness.    Therapeutic Modalities:   Motivational Interviewing Activity  Lynnell Chad

## 2020-10-20 NOTE — Progress Notes (Signed)
Patient has been up and active on the unit, attended group this evening and has voiced no complaints. Patient currently denies having pain, -si/hi/a/v hall. Support and encouragement offered, safety maintained on unit, will continue to monitor.  

## 2020-10-20 NOTE — [facility] Group Notes (Signed)
Adult Psychoeducational Group Not Date:  10/20/2020 Time:  0900-1045 Group Topic/Focus: PROGRESSIVE RELAXATION. A group where deep breathing is taught and tensing and relaxation muscle groups is used. Imagery is used as well.  Pts are asked to imagine 3 pillars that hold them up when they are not able to hold themselves up.  Participation Level:  Active  Participation Quality:  Appropriate  Affect:  Appropriate  Cognitive:  Oriented  Insight: Improving  Engagement in Group:  Engaged  Modes of Intervention:  Activity, Discussion, Education, and Support  Additional Comments:  Pt rates his energy at a 7/10. States what holds him up are his art, his frog and his great-grandmother.  Dione Housekeeper

## 2020-10-20 NOTE — Progress Notes (Signed)
   10/20/20 1950  COVID-19 Daily Checkoff  Have you had a fever (temp > 37.80C/100F)  in the past 24 hours?  No  If you have had runny nose, nasal congestion, sneezing in the past 24 hours, has it worsened? No  COVID-19 EXPOSURE  Have you traveled outside the state in the past 14 days? No  Have you been in contact with someone with a confirmed diagnosis of COVID-19 or PUI in the past 14 days without wearing appropriate PPE? No  Have you been living in the same home as a person with confirmed diagnosis of COVID-19 or a PUI (household contact)? No  Have you been diagnosed with COVID-19? No

## 2020-10-20 NOTE — Progress Notes (Signed)
   10/20/20 0900  Psych Admission Type (Psych Patients Only)  Admission Status Voluntary  Psychosocial Assessment  Patient Complaints Anxiety;Depression  Eye Contact Fair  Facial Expression Anxious  Affect Anxious  Speech Logical/coherent  Interaction Minimal  Motor Activity Fidgety  Appearance/Hygiene Unremarkable  Behavior Characteristics Appropriate to situation;Cooperative  Mood Depressed;Anxious  Thought Process  Coherency WDL  Content WDL  Delusions None reported or observed  Perception WDL  Hallucination None reported or observed  Judgment Poor  Confusion None  Danger to Self  Current suicidal ideation? Denies  Self-Injurious Behavior No self-injurious ideation or behavior indicators observed or expressed   Danger to Others  Danger to Others None reported or observed   Orders reviewed. Vital signs reviewed. Verbal support provided. 15 minute checks performed for safety.   Patient compliant with treatment plan.

## 2020-10-21 MED ORDER — HYDROXYZINE HCL 25 MG PO TABS
25.0000 mg | ORAL_TABLET | Freq: Three times a day (TID) | ORAL | 0 refills | Status: DC | PRN
Start: 2020-10-21 — End: 2024-07-10

## 2020-10-21 MED ORDER — ARIPIPRAZOLE 10 MG PO TABS: 10.0000 mg | ORAL_TABLET | Freq: Every day | ORAL | 0 refills | Status: DC

## 2020-10-21 MED ORDER — SERTRALINE HCL 50 MG PO TABS: 150.0000 mg | ORAL_TABLET | Freq: Every day | ORAL | 0 refills | Status: DC

## 2020-10-21 NOTE — Progress Notes (Signed)
D:  Patient denied SI and HI, contracts for safety.  Denied A/V hallucinations.  Denied pain. A:  Medications administered per MD orders.  Emotional support and encouragement given patient. R:  Safety maintained with 15 minute checks.  

## 2020-10-21 NOTE — Progress Notes (Signed)
  [facility] Adult Case Management Discharge Plan :  Will you be returning to the same living situation after discharge:  Yes,  Home  At discharge, do you have transportation home?: Yes,  Mother Do you have the ability to pay for your medications: Yes,  Insurance  Release of information consent forms completed and in the chart;  Patient's signature needed at discharge.  Patient to Follow up at:  Follow-up Information    Reed Breech, PhD Follow up on 10/22/2020.   Specialty: Psychology Why: You have an appointment on 10/22/20 at 2:30 pm with Dr. Lynetta Mare for therapy services.  Please call the provider if you have any questions or would like to change the appointment date. Contact information: [address] [place] [address] [phone]        Thedore Mins, MD Follow up on 10/25/2020.   Specialty: Psychiatry Why: You have an appointment with Dr. Hardie Lora for medication managemen 10/25/20 at 11:15 am. This appointment will be Virtual.  **PLEASE CALL PRIOR TO APPT TO PAY THE COPAY  Contact information: [address] [place] [address] [phone]               Next level of care provider has access to [facility] Link:no  Safety Planning and Suicide Prevention discussed: Yes,  with mother and patient   Have you used any form of tobacco in the last 30 days? (Cigarettes, Smokeless Tobacco, Cigars, and/or Pipes): No  Has patient been referred to the Quitline?: N/A patient is not a smoker  Patient has been referred for addiction treatment: N/A  [name], LCSWA 10/21/2020, 9:16 AM
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
## 2020-10-21 NOTE — Progress Notes (Signed)
Recreation Therapy Notes  Date: 2.14.22 Time: 0930 Location: 300 Hall Group Room  Group Topic: Stress Management   Goal Area(s) Addresses:  Patient will actively participate in stress management techniques presented during session.   Intervention: Stress management techniques  Activity :Guided Imagery.  LRT was to read a script that took patients on a walk through the forest to visually experience the sights, sounds and smells of the forest to relax.  Patients were to listen and follow along as script was read to engage in activity.  Education:  Stress Management, Discharge Planning.   Education Outcome: Acknowledges education  Clinical Observations/Feedback: Patient did not attend group.    Lennan Malone, LRT/CTRS         Dedrick Heffner A 10/21/2020 11:11 AM 

## 2020-10-21 NOTE — BHH Suicide Risk Assessment (Signed)
[facility] Discharge Suicide Risk Assessment   Principal Problem: Severe recurrent major depression without psychotic features [facility]) Discharge Diagnoses: Principal Problem:   Severe recurrent major depression without psychotic features (HCC) Active Problems:   Suicidal ideation   Total Time spent with patient: 20 minutes  Musculoskeletal: Strength & Muscle Tone: within normal limits Gait & Station: normal Patient leans: N/A  Psychiatric Specialty Exam: Review of Systems  All other systems reviewed and are negative.   Blood pressure 114/62, pulse 84, temperature 97.7 F (36.5 C), temperature source Oral, resp. rate 16, SpO2 100 %.There is no height or weight on file to calculate BMI.  General Appearance: Casual  Eye Contact::  Fair  Speech:  Normal Rate409  Volume:  Normal  Mood:  Anxious  Affect:  Congruent  Thought Process:  Coherent and Descriptions of Associations: Intact  Orientation:  Full (Time, Place, and Person)  Thought Content:  Logical  Suicidal Thoughts:  No  Homicidal Thoughts:  No  Memory:  Immediate;   Fair Recent;   Fair Remote;   Fair  Judgement:  Intact  Insight:  Fair  Psychomotor Activity:  Normal  Concentration:  Fair  Recall:  [employer] of Knowledge:Fair  Language: Good  Akathisia:  Negative  Handed:  Right  AIMS (if indicated):     Assets:  Desire for Improvement Financial Resources/Insurance Housing Resilience Social Support  Sleep:  Number of Hours: 5.75  Cognition: WNL  ADL's:  Intact   Mental Status Per Nursing Assessment::   On Admission:  Suicidal ideation indicated by patient,Self-harm thoughts  Demographic Factors:  Male and Adolescent or young adult  Loss Factors: Financial problems/change in socioeconomic status  Historical Factors: Prior suicide attempts and Impulsivity  Risk Reduction Factors:   Living with another person, especially a relative, Positive social support and Positive therapeutic relationship  Continued  Clinical Symptoms:  Severe Anxiety and/or Agitation Depression:   Impulsivity  Cognitive Features That Contribute To Risk:  None    Suicide Risk:  Minimal: No identifiable suicidal ideation.  Patients presenting with no risk factors but with morbid ruminations; may be classified as minimal risk based on the severity of the depressive symptoms   Follow-up Information    Reed Breech, PhD Follow up on 10/22/2020.   Specialty: Psychology Why: You have an appointment on 10/22/20 at 2:30 pm with Dr. Lynetta Mare for therapy services.  Please call the provider if you have any questions or would like to change the appointment date. Contact information: [address] [place] [address] [phone]        Thedore Mins, MD Follow up on 10/25/2020.   Specialty: Psychiatry Why: You have an appointment with Dr. Hardie Lora for medication managemen 10/25/20 at 11:15 am. This appointment will be Virtual.  **PLEASE CALL PRIOR TO APPT TO PAY THE COPAY  Contact information: [address] [place] [address] [phone]               Plan Of Care/Follow-up recommendations:  Activity:  ad lib  Antonieta Pert, MD 10/21/2020, 8:57 AM

## 2020-10-21 NOTE — Progress Notes (Signed)
RN met with pt and reviewed pt's discharge instructions.  Pt verbalized understanding of discharge instructions and pt did not have any questions. RN reviewed and provided pt with a copy of SRA, AVS and Transition Record.  RN returned pt's belongings to pt. Pt denied SI/HI/AVH and voiced no concerns.  Pt was appreciative of the care pt received at [facility].  Patient discharged to the lobby where pt's mother was waiting to take pt home.  RN also reviewed AVS with pt's mom per pt request.

## 2020-10-21 NOTE — BHH Group Notes (Signed)
Occupational Therapy Group Note Date: 10/21/2020 Group Topic/Focus: Brain Fitness  Group Description: Group encouraged increased social engagement and participation through discussion/activity focused on brain fitness. Patients were provided education on various brain fitness activities/strategies, with explanation provided on the qualifying factors including: one, that is has to be challenging/hard and two, it has to be something that you do not do every day. Patients engaged actively during group session in various brain fitness activities to increase attention, concentration, and problem-solving skills. Discussion followed with a focus on identifying the benefits of brain fitness activities as use for adaptive coping strategies and distraction.    Therapeutic Goal(s): Identify benefit(s) of brain fitness activities as use for adaptive coping and healthy distraction. Identify specific brain fitness activities to engage in as use for adaptive coping and healthy distraction.  Participation Level: Active   Participation Quality: Independent   Behavior: Calm and Cooperative   Speech/Thought Process: Focused   Affect/Mood: Euthymic   Insight: Moderate   Judgement: Moderate   Individualization: Khamani was active in his participation of discussion and activity, expressed feeling "good" and shared that he was leaving today and looking forward to eating [employer] and watching a movie. Pt identified brain fitness activity he could do in the future as "watch a movie or go for a walk."  Modes of Intervention: Activity, Discussion, Education, Socialization and Support  Patient Response to Interventions:  Attentive, Engaged, Receptive and Interested   Plan: Continue to engage patient in OT groups 2 - 3x/week.  10/21/2020  Donne Hazel, MOT, OTR/L
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
## 2020-10-21 NOTE — Plan of Care (Signed)
Nurse discussed anxiety, depression and coping skills with patient.  

## 2020-10-21 NOTE — Tx Team (Signed)
Interdisciplinary Treatment and Diagnostic Plan Update  10/21/2020 Time of Session: 9:10am  Jesse Schultz MRN: [id number]  Principal Diagnosis: Severe recurrent major depression without psychotic features [facility])  Secondary Diagnoses: Principal Problem:   Severe recurrent major depression without psychotic features (HCC) Active Problems:   Suicidal ideation   Current Medications:  Current Facility-Administered Medications  Medication Dose Route Frequency Provider Last Rate Last Admin  . acetaminophen (TYLENOL) tablet 650 mg  650 mg Oral Q6H PRN Nira Conn A, NP      . alum & mag hydroxide-simeth (MAALOX/MYLANTA) 200-200-20 MG/5ML suspension 30 mL  30 mL Oral Q4H PRN Nira Conn A, NP      . ARIPiprazole (ABILIFY) tablet 10 mg  10 mg Oral Daily Nira Conn A, NP   10 mg at 10/21/20 0744  . hydrOXYzine (ATARAX/VISTARIL) tablet 25 mg  25 mg Oral TID PRN Jackelyn Poling, NP   25 mg at 10/20/20 2128  . magnesium hydroxide (MILK OF MAGNESIA) suspension 30 mL  30 mL Oral Daily PRN Nira Conn A, NP      . sertraline (ZOLOFT) tablet 150 mg  150 mg Oral Daily Nira Conn A, NP   150 mg at 10/21/20 0744  . traZODone (DESYREL) tablet 50 mg  50 mg Oral QHS PRN Jackelyn Poling, NP   50 mg at 10/20/20 2128   PTA Medications: Medications Prior to Admission  Medication Sig Dispense Refill Last Dose  . ARIPiprazole (ABILIFY) 5 MG tablet Take by mouth.     . sertraline (ZOLOFT) 100 MG tablet Take by mouth.       Patient Stressors: [employer] fast streamer difficulties Marital or family conflict Occupational concerns  Patient Strengths: Ability for insight Active sense of humor Average or above average intelligence Capable of independent living Wellsite geologist fund of knowledge Motivation for treatment/growth Supportive family/friends  Treatment Modalities: Medication Management, Group therapy, Case management,  1 to 1 session with clinician, Psychoeducation,  Recreational therapy.   Physician Treatment Plan for Primary Diagnosis: Severe recurrent major depression without psychotic features (HCC) Long Term Goal(s): Improvement in symptoms so as ready for discharge Improvement in symptoms so as ready for discharge   Short Term Goals: Ability to identify changes in lifestyle to reduce recurrence of condition will improve Ability to verbalize feelings will improve Ability to disclose and discuss suicidal ideas Ability to demonstrate self-control will improve Ability to identify and develop effective coping behaviors will improve Ability to identify changes in lifestyle to reduce recurrence of condition will improve Ability to verbalize feelings will improve Ability to disclose and discuss suicidal ideas Ability to demonstrate self-control will improve Ability to identify and develop effective coping behaviors will improve  Medication Management: Evaluate patient's response, side effects, and tolerance of medication regimen.  Therapeutic Interventions: 1 to 1 sessions, Unit Group sessions and Medication administration.  Evaluation of Outcomes: Adequate for Discharge  Physician Treatment Plan for Secondary Diagnosis: Principal Problem:   Severe recurrent major depression without psychotic features (HCC) Active Problems:   Suicidal ideation  Long Term Goal(s): Improvement in symptoms so as ready for discharge Improvement in symptoms so as ready for discharge   Short Term Goals: Ability to identify changes in lifestyle to reduce recurrence of condition will improve Ability to verbalize feelings will improve Ability to disclose and discuss suicidal ideas Ability to demonstrate self-control will improve Ability to identify and develop effective coping behaviors will improve Ability to identify changes in lifestyle to reduce recurrence of condition will  improve Ability to verbalize feelings will improve Ability to disclose and discuss suicidal  ideas Ability to demonstrate self-control will improve Ability to identify and develop effective coping behaviors will improve     Medication Management: Evaluate patient's response, side effects, and tolerance of medication regimen.  Therapeutic Interventions: 1 to 1 sessions, Unit Group sessions and Medication administration.  Evaluation of Outcomes: Adequate for Discharge   RN Treatment Plan for Primary Diagnosis: Severe recurrent major depression without psychotic features (HCC) Long Term Goal(s): Knowledge of disease and therapeutic regimen to maintain health will improve  Short Term Goals: Ability to remain free from injury will improve, Ability to demonstrate self-control, Ability to participate in decision making will improve, Ability to disclose and discuss suicidal ideas and Ability to identify and develop effective coping behaviors will improve  Medication Management: RN will administer medications as ordered by provider, will assess and evaluate patient's response and provide education to patient for prescribed medication. RN will report any adverse and/or side effects to prescribing provider.  Therapeutic Interventions: 1 on 1 counseling sessions, Psychoeducation, Medication administration, Evaluate responses to treatment, Monitor vital signs and CBGs as ordered, Perform/monitor CIWA, COWS, AIMS and Fall Risk screenings as ordered, Perform wound care treatments as ordered.  Evaluation of Outcomes: Adequate for Discharge   LCSW Treatment Plan for Primary Diagnosis: Severe recurrent major depression without psychotic features (HCC) Long Term Goal(s): Safe transition to appropriate next level of care at discharge, Engage patient in therapeutic group addressing interpersonal concerns.  Short Term Goals: Engage patient in aftercare planning with referrals and resources, Increase emotional regulation, Facilitate acceptance of mental health diagnosis and concerns, Facilitate patient  progression through stages of change regarding substance use diagnoses and concerns and Increase skills for wellness and recovery  Therapeutic Interventions: Assess for all discharge needs, 1 to 1 time with Social worker, Explore available resources and support systems, Assess for adequacy in community support network, Educate family and significant other(s) on suicide prevention, Complete Psychosocial Assessment, Interpersonal group therapy.  Evaluation of Outcomes: Adequate for Discharge   Progress in Treatment: Attending groups: Yes. Participating in groups: Yes. Taking medication as prescribed: Yes. Toleration medication: Yes. Family/Significant other contact made: Yes, individual(s) contacted:  mother Patient understands diagnosis: No. Discussing patient identified problems/goals with staff: Yes. Medical problems stabilized or resolved: Yes. Denies suicidal/homicidal ideation: Yes. Issues/concerns per patient self-inventory: No.   New problem(s) identified: No, Describe:  None   New Short Term/Long Term Goal(s): medication stabilization, elimination of SI thoughts, development of comprehensive mental wellness plan.   Patient Goals:  "I don't have any goals"   Discharge Plan or Barriers: Patient to return home and continue services with established outpatient providers.   Reason for Continuation of Hospitalization: Medication stabilization  Estimated Length of Stay: 1-3 days  Attendees: Patient: 10/16/2020   Physician: 10/16/2020   Nursing:  10/16/2020   RN Care Manager: 10/16/2020   Social Worker: Ruthann Cancer, LCSW 10/16/2020   Recreational Therapist:  10/16/2020   Other:  10/16/2020   Other:  10/16/2020   Other: 10/16/2020     Scribe for Treatment Team: Otelia Santee, LCSW 10/21/2020 9:21 AM

## 2021-07-17 ENCOUNTER — Encounter (HOSPITAL_COMMUNITY): Payer: Self-pay | Admitting: *Deleted

## 2021-07-17 ENCOUNTER — Emergency Department (HOSPITAL_COMMUNITY)
Admission: EM | Admit: 2021-07-17 | Discharge: 2021-07-18 | Disposition: A | Discharge status: Home / Self Care | Payer: BC Managed Care – PPO | Attending: Emergency Medicine | Admitting: Emergency Medicine

## 2021-07-17 ENCOUNTER — Ambulatory Visit (HOSPITAL_COMMUNITY)
Admission: EM | Admit: 2021-07-17 | Discharge: 2021-07-17 | Disposition: A | Discharge status: Home / Self Care | Payer: BC Managed Care – PPO | Attending: Internal Medicine | Admitting: Internal Medicine

## 2021-07-17 ENCOUNTER — Other Ambulatory Visit: Payer: Self-pay

## 2021-07-17 ENCOUNTER — Encounter (HOSPITAL_COMMUNITY): Payer: Self-pay

## 2021-07-17 DIAGNOSIS — J45909 Unspecified asthma, uncomplicated: Secondary | ICD-10-CM | POA: Insufficient documentation

## 2021-07-17 DIAGNOSIS — R55 Syncope and collapse: Secondary | ICD-10-CM | POA: Diagnosis present

## 2021-07-17 DIAGNOSIS — R42 Dizziness and giddiness: Secondary | ICD-10-CM | POA: Diagnosis not present

## 2021-07-17 DIAGNOSIS — R1110 Vomiting, unspecified: Secondary | ICD-10-CM | POA: Diagnosis not present

## 2021-07-17 LAB — BASIC METABOLIC PANEL
Anion gap: 8 (ref 5–15)
BUN: 11 mg/dL (ref 6–20)
CO2: 24 mmol/L (ref 22–32)
Calcium: 9.2 mg/dL (ref 8.9–10.3)
Chloride: 107 mmol/L (ref 98–111)
Creatinine, Ser: 0.9 mg/dL (ref 0.61–1.24)
GFR, Estimated: >60 mL/min (ref >60)
Glucose, Bld: 107 mg/dL — ABNORMAL HIGH (ref 70–99)
Potassium: 3.8 mmol/L (ref 3.5–5.1)
Sodium: 139 mmol/L (ref 135–145)

## 2021-07-17 LAB — CBC
HCT: 42.6 % (ref 39.0–52.0)
Hemoglobin: 14.1 g/dL (ref 13.0–17.0)
MCH: 30.7 pg (ref 26.0–34.0)
MCHC: 33.1 g/dL (ref 30.0–36.0)
MCV: 92.8 fL (ref 80.0–100.0)
Platelets: 177 10*3/uL (ref 150–400)
RBC: 4.59 MIL/uL (ref 4.22–5.81)
RDW: 11.9 % (ref 11.5–15.5)
WBC: 13.6 10*3/uL — ABNORMAL HIGH (ref 4.0–10.5)
nRBC: 0 % (ref 0.0–0.2)

## 2021-07-17 LAB — CBG MONITORING, ED

## 2021-07-17 MED ORDER — SODIUM CHLORIDE 0.9 % IV BOLUS
1000.0000 mL | Freq: Once | INTRAVENOUS | Status: AC
  Administered 2021-07-17: 1000 mL via INTRAVENOUS

## 2021-07-17 NOTE — ED Triage Notes (Signed)
Pt BIB [facility] for eval of syncope x 2. Once at work, once while at [facility] for eval of syncope. EMS reports pt became pale, vomited and passed out while at [facility]. During triage, pt stated he was going to vomit, became remarkably pale and presyncopal for about 2 minutes. Did not lose consciousness. BP 100/60 during episode, seated position, and supervised by medical staff. 1L [place] bolus infusing in triage

## 2021-07-17 NOTE — ED Provider Notes (Signed)
Emergency Medicine Provider Triage Evaluation Note  Jesse Schultz , a 20 y.o. male  was evaluated in triage.  Pt complains of syncope.  Had 2 syncopal episodes today, 1 witnessed at the urgent care and associated with lightheadedness and 1 episode of emesis.  Denies any chest pain or shortness of breath, no history of seizures.  Review of Systems  Positive: Syncope, nausea, vomiting, lightheadedness Negative: Chest pain, shortness of breath  Physical Exam  BP 100/68   Pulse 65   Temp 97.8 F (36.6 C)   Resp 18   Ht 5\' 9"  (1.753 m)   Wt 55.8 kg   SpO2 98%   BMI 18.16 kg/m  Gen:   Awake, no distress   Resp:  Normal effort  MSK:   Moves extremities without difficulty  Other:  Regular rhythm, lungs are clear to auscultation bilaterally  Medical Decision Making  Medically screening exam initiated at 3:40 PM.  Appropriate orders placed.  VONTAE COURT was informed that the remainder of the evaluation will be completed by another provider, this initial triage assessment does not replace that evaluation, and the importance of remaining in the ED until their evaluation is complete.  Presyncope work-up   Mattie Marlin, PA-C 07/17/21 1541    13/10/22, DO 07/17/21 1546

## 2021-07-17 NOTE — ED Notes (Signed)
Located patient at intake desk on floor.  Patient answered my questions immediately.  Reports he had passed out earlier today.  Patient's head lying in vomit, patient vomited a second time, bile material.  Patient maintained position on right side on floor.  Patient continued to answer questions appropriately.  Patient's family member remained with him.  Patient referenced having a "bar in his chest" as far as history.  Staff including Dorann Ou, PA and Roosvelt Maser, [place] arrived in lobby and evaluated patient.  Clinical staff called EMS/cbg performed.    Patient's color improved, assisted to wheelchair and into intake and onto in take exam table to recline.  Family remains with patient.  Vital signs repeated as documented.  Patient did not complain of anything

## 2021-07-17 NOTE — ED Triage Notes (Addendum)
Pt reports he was at work and was sent by his co workers because he did not look good . Pt felt dizzy today and upon arrival to [facility] Pt vomited while at greater's desk and fell to floor. Pt had seizure like activityafter fall and vomited x-2.Marland KitchenPt vomited while on floor.Pt denies any Hx of seizures.

## 2021-07-18 NOTE — ED Provider Notes (Signed)
[facility] EMERGENCY DEPARTMENT Provider Note   CSN: [id number] Arrival date & time: 07/17/21  1519     History Chief Complaint  Patient presents with   Loss of Consciousness    Jesse Schultz is a 20 y.o. male.  Has a history of syncope has done multiple times.  Patient had another episode today he thinks related dehydration as he has not been eating and drinking normally recently.  Patient has been in the emergency room for approximate 13 hours evaluation and states that he feels better at this time.  Needs a note for work.  No headache, chest pain, palpitations, Donnell pain, back pain or other preceding events.  None since the event either.   Loss of Consciousness     Past Medical History:  Diagnosis Date   Allergy    Anxiety    Asthma    Depression    Fainting spell    GERD (gastroesophageal reflux disease)     Patient Active Problem List   Diagnosis Date Noted   Severe recurrent major depression without psychotic features ([place]) 10/15/2020   Suicidal ideation    MDD (major depressive disorder), recurrent severe, without psychosis ([place])    Anxiety and depression 07/15/2020   GERD (gastroesophageal reflux disease) 07/15/2020   Preventative health care 07/15/2020   Pectus excavatum 07/15/2020   Transient alteration of awareness 07/16/2015   Attention deficit hyperactivity disorder, 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    Past Surgical History:  Procedure Laterality Date   ADENOIDECTOMY AND MYRINGOTOMY WITH TUBE PLACEMENT     CIRCUMCISION     PECTUS BAR INSERTION PEDIATRIC  2021   TYMPANOPLASTY     TYMPANOSTOMY TUBE PLACEMENT     WISDOM TOOTH EXTRACTION         Family History  Problem Relation Age of Onset   Allergies Mother    Asthma Maternal Grandmother    Allergies Maternal Grandmother    Heart disease Maternal Grandfather     Social History   Tobacco Use   Smoking status: Never   Smokeless tobacco: Never  Vaping Use   Vaping  Use: Never used  Substance Use Topics   Alcohol use: Yes    Alcohol/week: 3.0 standard drinks    Types: 3 Cans of beer per week    Comment: 3 cans beer every week   Drug use: No    Home Medications Prior to Admission medications   Medication Sig Start Date End Date Taking? Authorizing Provider  ARIPiprazole (ABILIFY) 10 MG tablet Take 1 tablet (10 mg total) by mouth daily. 10/22/20   Sharma Covert, MD  hydrOXYzine (ATARAX/VISTARIL) 25 MG tablet Take 1 tablet (25 mg total) by mouth 3 (three) times daily as needed for anxiety. 10/21/20   Sharma Covert, MD  sertraline (ZOLOFT) 50 MG tablet Take 3 tablets (150 mg total) by mouth daily. 10/22/20   Sharma Covert, MD    Allergies    Other  Review of Systems   Review of Systems  Cardiovascular:  Positive for syncope.  All other systems reviewed and are negative.  Physical Exam Updated Vital Signs BP (!) 120/56   Pulse 74   Temp 98 F (36.7 C) (Oral)   Resp 16   Ht 5\' 9"  (1.753 m)   Wt 55.8 kg   SpO2 95%   BMI 18.16 kg/m   Physical Exam Vitals and nursing note reviewed.  Constitutional:      Appearance: He is well-developed.  HENT:  Head: Normocephalic and atraumatic.     Mouth/Throat:     Mouth: Mucous membranes are moist.     Pharynx: Oropharynx is clear.  Eyes:     Pupils: Pupils are equal, round, and reactive to light.  Cardiovascular:     Rate and Rhythm: Normal rate.  Pulmonary:     Effort: Pulmonary effort is normal. No respiratory distress.  Abdominal:     General: Abdomen is flat. There is no distension.  Musculoskeletal:        General: Normal range of motion.     Cervical back: Normal range of motion.  Skin:    General: Skin is warm and dry.     Coloration: Skin is not jaundiced or pale.  Neurological:     General: No focal deficit present.     Mental Status: He is alert.    ED Results / Procedures / Treatments   Labs (all labs ordered are listed, but only abnormal results are  displayed) Labs Reviewed  BASIC METABOLIC PANEL - Abnormal; Notable for the following components:      Result Value   Glucose, Bld 107 (*)    All other components within normal limits  CBC - Abnormal; Notable for the following components:   WBC 13.6 (*)    All other components within normal limits  CBG MONITORING, ED    EKG EKG Interpretation  Date/Time:  Thursday July 17 2021 15:23:17 EST Ventricular Rate:  67 PR Interval:  118 QRS Duration: 94 QT Interval:  396 QTC Calculation: 418 R Axis:   91 Text Interpretation: Normal sinus rhythm Rightward axis Borderline ECG Confirmed by Marily Memos [phone]) on 07/18/2021 4:40:30 AM  Radiology No results found.  Procedures Procedures   Medications Ordered in ED Medications - No data to display  ED Course  I have reviewed the triage vital signs and the nursing notes.  Pertinent labs & imaging results that were available during my care of the patient were reviewed by me and considered in my medical decision making (see chart for details).    MDM Rules/Calculators/A&P                         No red flags for syncope requiring further workup. Asymptomatic now. Work note provided.    Final Clinical Impression(s) / ED Diagnoses Final diagnoses:  Syncope, unspecified syncope type    Rx / DC Orders ED Discharge Orders     None        Carriann Hesse, Barbara Cower, MD 07/19/21 [phone]

## 2021-07-23 ENCOUNTER — Other Ambulatory Visit: Payer: Self-pay

## 2021-07-23 ENCOUNTER — Encounter: Payer: Self-pay | Admitting: Primary Care

## 2021-07-23 ENCOUNTER — Ambulatory Visit (INDEPENDENT_AMBULATORY_CARE_PROVIDER_SITE_OTHER): Payer: BC Managed Care – PPO | Admitting: Primary Care

## 2021-07-23 DIAGNOSIS — F32A Depression, unspecified: Secondary | ICD-10-CM

## 2021-07-23 DIAGNOSIS — F419 Anxiety disorder, unspecified: Secondary | ICD-10-CM

## 2021-07-23 DIAGNOSIS — Q676 Pectus excavatum: Secondary | ICD-10-CM | POA: Diagnosis not present

## 2021-07-23 DIAGNOSIS — R55 Syncope and collapse: Secondary | ICD-10-CM | POA: Insufficient documentation

## 2021-07-23 NOTE — Progress Notes (Signed)
Subjective:    Patient ID: CASE LAMMERS, male    DOB: 03-21-2001, 20 y.o.   MRN: [id number]  HPI  Jesse Schultz is a very pleasant 20 y.o. male with a history of MDD, suicidal ideation, ADHD, syncope,GAD who presents today for urgent care follow up. His mother is with him today.   He presented to [facility] on 07/18/21 for recurrent syncope, most recent episode being earlier that day while at work without acute injury. One episode witnessed at urgent care for which he was evaluated prior to his ED visit. He endorsed causes to be dehydration as he had not been eating or drinking regularly.   Work up in the ED consisted of labs which showed mild leukocytosis (WBC 13.6) but other wise unremarkable. He underwent ECG which showed NSR with rate of 67. No acute process. Upon MD review he had been in the ED for "13 hours" and endorsed feeling better. He was discharged home early that following morning.   Today he endorses a history of syncope that dates back to late elementary age. His mother can count about 5 lifetime episodes of syncope, has been evaluated in the hospital twice, and then again recently.   His pediatrician sent him to cardiology who completed a "work up", has also been evaluated by [facility] who suspected Ehlers Danlos Syndrome as he had "some markers" but was not officially diagnosed as he didn't have enough "markers". He was advised at the time to increase salt, protein, water intake.   Today he confirms that he has been eating unhealthy, also not drinking enough water. He mostly eats fast food and take out food, drinks 1/2-1 bottle of water daily, mostly drinks soda.   He does notice dizziness with rapid position changes, sometimes in the morning. His mom mentions that he did not have his pectus excavatum when evaluated by cardiology years ago. He saw his surgeon one year ago from pectus excavatum surgery, everything was fine at that point.   He denies chest pain or pressure from the  bars in place in his chest. He denies increased anxiety.   BP Readings from Last 3 Encounters:  07/23/21 (!) 88/46  07/18/21 (!) 120/56  07/17/21 110/61      Review of Systems  Eyes:  Negative for visual disturbance.  Respiratory:  Negative for shortness of breath.   Cardiovascular:  Negative for chest pain.  Neurological:  Positive for syncope and light-headedness. Negative for headaches.        Past Medical History:  Diagnosis Date   Allergy    Anxiety    Asthma    Depression    Fainting spell    GERD (gastroesophageal reflux disease)     Social History   Socioeconomic History   Marital status: Single    Spouse name: Not on file   Number of children: 0   Years of education: Not on file   Highest education level: High school graduate  Occupational History   Not on file  Tobacco Use   Smoking status: Never   Smokeless tobacco: Never  Vaping Use   Vaping Use: Never used  Substance and Sexual Activity   Alcohol use: Yes    Alcohol/week: 3.0 standard drinks    Types: 3 Cans of beer per week    Comment: 3 cans beer every week   Drug use: No   Sexual activity: Yes    Birth control/protection: None  Other Topics Concern   Not on file  Social History Narrative   Rett is in ninth grade at [employer]. He is meeting most of the goals on his IEP.He struggles with math.   Cindy lives with his parents, and two brothers.   Social Determinants of Health   Financial Resource Strain: Not on file  Food Insecurity: Not on file  Transportation Needs: Not on file  Physical Activity: Not on file  Stress: Not on file  Social Connections: Not on file  Intimate Partner Violence: Not on file    Past Surgical History:  Procedure Laterality Date   ADENOIDECTOMY AND MYRINGOTOMY WITH TUBE PLACEMENT     CIRCUMCISION     PECTUS BAR INSERTION PEDIATRIC  2021   TYMPANOPLASTY     TYMPANOSTOMY TUBE PLACEMENT     WISDOM TOOTH EXTRACTION      Family History   Problem Relation Age of Onset   Allergies Mother    Asthma Maternal Grandmother    Allergies Maternal Grandmother    Heart disease Maternal Grandfather     Allergies  Allergen Reactions   Other Cough    Seasonal Allergies in Spring & Fall  Seasonal Allergies in Spring & Fall    Current Outpatient Medications on File Prior to Visit  Medication Sig Dispense Refill   ARIPiprazole (ABILIFY) 10 MG tablet Take 1 tablet (10 mg total) by mouth daily. 30 tablet 0   hydrOXYzine (ATARAX/VISTARIL) 25 MG tablet Take 1 tablet (25 mg total) by mouth 3 (three) times daily as needed for anxiety. 30 tablet 0   sertraline (ZOLOFT) 50 MG tablet Take 3 tablets (150 mg total) by mouth daily. 30 tablet 0   No current facility-administered medications on file prior to visit.    BP (!) 88/46   Pulse 79   Temp 97.8 F (36.6 C) (Temporal)   Ht 5\' 9"  (1.753 m)   Wt 141 lb (64 kg)   SpO2 97%   BMI 20.82 kg/m  Objective:   Physical Exam Cardiovascular:     Rate and Rhythm: Normal rate and regular rhythm.  Pulmonary:     Effort: Pulmonary effort is normal.     Breath sounds: Normal breath sounds. No wheezing or rales.  Musculoskeletal:     Cervical back: Neck supple.     Comments: Chest wall appears normal. No pectus excavatum noted.   Skin:    General: Skin is warm and dry.  Neurological:     Mental Status: He is alert and oriented to person, place, and time.  Psychiatric:        Mood and Affect: Mood normal.          Assessment & Plan:      This visit occurred during the SARS-CoV-2 public health emergency.  Safety protocols were in place, including screening questions prior to the visit, additional usage of staff PPE, and extensive cleaning of exam room while observing appropriate contact time as indicated for disinfecting solutions.

## 2021-07-23 NOTE — Assessment & Plan Note (Signed)
S/P repair in 2021.   Mom will touch base with surgeon in light of recent syncopal events.

## 2021-07-23 NOTE — Assessment & Plan Note (Addendum)
Chronic for years, this is the first I am hearing of this today.  Labs reviewed from ED visit, overall stable. Checking labs today.   He was hypotensive in office today. Strongly advised to increase water intake, improve diet, increase protein.   He is orthostatic today upon vital sign evaluation. discussed to rise slowly from seated position. Changes positions slowly.  Referral placed to cardiology. His mom will schedule him an appointment with his surgeon.   ED precautions provided.

## 2021-07-23 NOTE — Assessment & Plan Note (Signed)
Denies concerns today.  

## 2021-07-23 NOTE — Patient Instructions (Signed)
You will be contacted regarding your referral to cardiology.  Please let us know if you have not been contacted within two weeks.   Ensure you are consuming 64 ounces of water daily.  It's important to improve your diet by reducing consumption of fast food, fried food, processed snack foods, sugary drinks. Increase consumption of fresh vegetables and fruits, whole grains, water.  Ensure you are drinking 64 ounces of water daily.  Stop by the lab prior to leaving today. I will notify you of your results once received.   It was a pleasure to see you today!

## 2021-07-24 LAB — CBC WITH DIFFERENTIAL/PLATELET
Absolute Monocytes: 377 cells/uL (ref 200–950)
Basophils Absolute: 31 cells/uL (ref 0–200)
Basophils Relative: 0.6 %
Eosinophils Absolute: 138 cells/uL (ref 15–500)
Eosinophils Relative: 2.7 %
HCT: 43.3 % (ref 38.5–50.0)
Hemoglobin: 14.8 g/dL (ref 13.2–17.1)
Lymphs Abs: 2101 cells/uL (ref 850–3900)
MCH: 31 pg (ref 27.0–33.0)
MCHC: 34.2 g/dL (ref 32.0–36.0)
MCV: 90.8 fL (ref 80.0–100.0)
MPV: 10.8 fL (ref 7.5–12.5)
Monocytes Relative: 7.4 %
Neutro Abs: 2453 cells/uL (ref 1500–7800)
Neutrophils Relative %: 48.1 %
Platelets: 206 10*3/uL (ref 140–400)
RBC: 4.77 10*6/uL (ref 4.20–5.80)
RDW: 12.3 % (ref 11.0–15.0)
Total Lymphocyte: 41.2 %
WBC: 5.1 10*3/uL (ref 3.8–10.8)

## 2021-07-24 LAB — COMPREHENSIVE METABOLIC PANEL
AG Ratio: 1.9 (calc) (ref 1.0–2.5)
ALT: 13 U/L (ref 9–46)
AST: 14 U/L (ref 10–40)
Albumin: 4.8 g/dL (ref 3.6–5.1)
Alkaline phosphatase (APISO): 81 U/L (ref 36–130)
BUN: 9 mg/dL (ref 7–25)
CO2: 25 mmol/L (ref 20–32)
Calcium: 9.7 mg/dL (ref 8.6–10.3)
Chloride: 102 mmol/L (ref 98–110)
Creat: 0.73 mg/dL (ref 0.60–1.24)
Globulin: 2.5 g/dL (calc) (ref 1.9–3.7)
Glucose, Bld: 81 mg/dL (ref 65–99)
Potassium: 4.5 mmol/L (ref 3.5–5.3)
Sodium: 139 mmol/L (ref 135–146)
Total Bilirubin: 0.5 mg/dL (ref 0.2–1.2)
Total Protein: 7.3 g/dL (ref 6.1–8.1)

## 2021-07-24 LAB — TSH: TSH: 1.34 mIU/L (ref 0.40–4.50)

## 2021-07-24 LAB — CBG MONITORING, ED: Glucose-Capillary: 124 mg/dL — ABNORMAL HIGH (ref 70–99)

## 2021-08-04 ENCOUNTER — Other Ambulatory Visit (HOSPITAL_COMMUNITY): Payer: Self-pay | Admitting: Family

## 2021-08-04 DIAGNOSIS — R45851 Suicidal ideations: Secondary | ICD-10-CM

## 2021-08-04 DIAGNOSIS — F332 Major depressive disorder, recurrent severe without psychotic features: Secondary | ICD-10-CM

## 2021-08-12 NOTE — Progress Notes (Signed)
Cardiology Office Note:    Date:  08/12/2021   ID:  Jesse Schultz, DOB Nov 15, 2000, MRN [id number]  PCP:  Doreene Nest, NP   [facility] Providers Cardiologist:  None     Referring MD: Doreene Nest, NP   No chief complaint on file. Syncope  History of Present Illness:    Jesse Schultz is a 20 y.o. male with a hx below, no heart dx history, pectus excavatum, referral for syncope  He went to the ED on 07/18/21 for recurrent syncope. One episode was witness at urgent care. Patient endorsed dehydration. He was in the ED for 13 hours and then decided to go home.  He notes that he passed out. He was at work and moving boxes. He said he was dizzy, lightheaded and he sat down. He lost consciousness for 1 minute. No repeat episodes. No palpitations, no skipped. No SCD. Parents have no heart disease  He was seen at [facility] for ?Lorinda Creed.He was noted to have syncope 2018. Pediatric cardiology who evaluated him for syncope and diagnosed vasovagal syncope. 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 Echo was normal.  . Did not think he warranted genetic testing.   In Jan 2021, he had a Nuss procedure for pectus excavatum at [facility]. In the hospital there was no concern for arrhythmia.  Social Hx: working. No smoking, no drug use. Occasional etoh  Past Medical History:  Diagnosis Date   Allergy    Anxiety    Asthma    Depression    Fainting spell    GERD (gastroesophageal reflux disease)     Past Surgical History:  Procedure Laterality Date   ADENOIDECTOMY AND MYRINGOTOMY WITH TUBE PLACEMENT     CIRCUMCISION     PECTUS BAR INSERTION PEDIATRIC  2021   TYMPANOPLASTY     TYMPANOSTOMY TUBE PLACEMENT     WISDOM TOOTH EXTRACTION      Current Medications: No outpatient medications have been marked as taking for the 08/13/21 encounter (Appointment) with Maisie Fus, MD.     Allergies:   Other   Social History   Socioeconomic  History   Marital status: Single    Spouse name: Not on file   Number of children: 0   Years of education: Not on file   Highest education level: High school graduate  Occupational History   Not on file  Tobacco Use   Smoking status: Never   Smokeless tobacco: Never  Vaping Use   Vaping Use: Never used  Substance and Sexual Activity   Alcohol use: Yes    Alcohol/week: 3.0 standard drinks    Types: 3 Cans of beer per week    Comment: 3 cans beer every week   Drug use: No   Sexual activity: Yes    Birth control/protection: None  Other Topics Concern   Not on file  Social History Narrative   Jesse Schultz is in ninth grade at [employer]. He is meeting most of the goals on his IEP.He struggles with math.   Jesse Schultz lives with his parents, and two brothers.   Social Determinants of Health   Financial Resource Strain: Not on file  Food Insecurity: Not on file  Transportation Needs: Not on file  Physical Activity: Not on file  Stress: Not on file  Social Connections: Not on file     Family History: The patient's family history includes Allergies in his maternal grandmother and mother; Asthma in his maternal grandmother;  Heart disease in his maternal grandfather.  ROS:   Please see the history of present illness.     All other systems reviewed and are negative.  EKGs/Labs/Other Studies Reviewed:    The following studies were reviewed today:   EKG:  EKG is  ordered today.  The ekg ordered today demonstrates   NSR, no ischemic dx  Recent Labs: 07/23/2021: ALT 13; BUN 9; Creat 0.73; Hemoglobin 14.8; Platelets 206; Potassium 4.5; Sodium 139; TSH 1.34  Recent Lipid Panel    Component Value Date/Time   CHOL 122 10/14/2020 2146   TRIG 69 10/14/2020 2146   HDL 45 10/14/2020 2146   CHOLHDL 2.7 10/14/2020 2146   VLDL 14 10/14/2020 2146   LDLCALC 63 10/14/2020 2146     Risk Assessment/Calculations:           Physical Exam:    VS:  There were no vitals taken for  this visit.    Wt Readings from Last 3 Encounters:  07/23/21 141 lb (64 kg)  07/17/21 123 lb (55.8 kg)  10/14/20 139 lb 5.3 oz (63.2 kg) (25 %, Z= -0.68)*   * Growth percentiles are based on CDC (Boys, 2-20 Years) data.     GEN:  Well nourished, well developed in no acute distress HEENT: Normal NECK: No JVD; No carotid bruits LYMPHATICS: No lymphadenopathy CARDIAC: RRR, no murmurs, rubs, gallops RESPIRATORY:  Clear to auscultation without rales, wheezing or rhonchi  ABDOMEN: Soft, non-tender, non-distended MUSCULOSKELETAL:  No edema; No deformity  SKIN: Warm and dry NEUROLOGIC:  Alert and oriented x 3 PSYCHIATRIC:  Normal affect   ASSESSMENT:    #Vasovagal Syncope. His episode was consistent with vasovagal syncope. He had a prodrome. He has known hx of vasovagal syncope. He was mildly hypotensive that day as well and likely dehydrated. We discussed proper hydration. He does not have high risk features including syncope c/f arrhythmia , family hx of SCD, or abnormalities on his EKG. We discussed PRN follow up.   PLAN:    In order of problems listed above:  Follow up PRN      Medication Adjustments/Labs and Tests Ordered: Current medicines are reviewed at length with the patient today.  Concerns regarding medicines are outlined above.   Signed, Maisie Fus, MD  08/12/2021 12:49 PM    [facility]

## 2021-08-13 ENCOUNTER — Encounter: Payer: Self-pay | Admitting: Internal Medicine

## 2021-08-13 ENCOUNTER — Other Ambulatory Visit: Payer: Self-pay

## 2021-08-13 ENCOUNTER — Ambulatory Visit (INDEPENDENT_AMBULATORY_CARE_PROVIDER_SITE_OTHER): Payer: BC Managed Care – PPO | Admitting: Internal Medicine

## 2021-08-13 VITALS — BP 100/50 | HR 61 | Ht 69.0 in | Wt 141.0 lb

## 2021-08-13 DIAGNOSIS — R55 Syncope and collapse: Secondary | ICD-10-CM

## 2021-08-13 NOTE — Patient Instructions (Signed)

## 2022-10-13 ENCOUNTER — Inpatient Hospital Stay: Payer: Commercial Managed Care - PPO | Admitting: Primary Care

## 2022-11-04 ENCOUNTER — Emergency Department (HOSPITAL_COMMUNITY)
Admission: EM | Admit: 2022-11-04 | Discharge: 2022-11-04 | Disposition: A | Discharge status: Home / Self Care | Payer: Commercial Managed Care - PPO | Attending: Emergency Medicine | Admitting: Emergency Medicine

## 2022-11-04 ENCOUNTER — Emergency Department (HOSPITAL_COMMUNITY): Payer: Commercial Managed Care - PPO

## 2022-11-04 DIAGNOSIS — Y92410 Unspecified street and highway as the place of occurrence of the external cause: Secondary | ICD-10-CM | POA: Diagnosis not present

## 2022-11-04 DIAGNOSIS — R55 Syncope and collapse: Secondary | ICD-10-CM | POA: Insufficient documentation

## 2022-11-04 LAB — COMPREHENSIVE METABOLIC PANEL
ALT: 16 U/L (ref 0–44)
AST: 23 U/L (ref 15–41)
Albumin: 4.2 g/dL (ref 3.5–5.0)
Alkaline Phosphatase: 62 U/L (ref 38–126)
Anion gap: 11 (ref 5–15)
BUN: 7 mg/dL (ref 6–20)
CO2: 25 mmol/L (ref 22–32)
Calcium: 8.9 mg/dL (ref 8.9–10.3)
Chloride: 103 mmol/L (ref 98–111)
Creatinine, Ser: 0.79 mg/dL (ref 0.61–1.24)
GFR, Estimated: >60 mL/min (ref >60)
Glucose, Bld: 96 mg/dL (ref 70–99)
Potassium: 4.1 mmol/L (ref 3.5–5.1)
Sodium: 139 mmol/L (ref 135–145)
Total Bilirubin: 0.7 mg/dL (ref 0.3–1.2)
Total Protein: 6.9 g/dL (ref 6.5–8.1)

## 2022-11-04 LAB — CBC WITH DIFFERENTIAL/PLATELET
Abs Immature Granulocytes: 0.05 10*3/uL (ref 0.00–0.07)
Basophils Absolute: 0 10*3/uL (ref 0.0–0.1)
Basophils Relative: 0 %
Eosinophils Absolute: 0 10*3/uL (ref 0.0–0.5)
Eosinophils Relative: 0 %
HCT: 40.6 % (ref 39.0–52.0)
Hemoglobin: 14 g/dL (ref 13.0–17.0)
Immature Granulocytes: 0 %
Lymphocytes Relative: 9 %
Lymphs Abs: 1.2 10*3/uL (ref 0.7–4.0)
MCH: 32 pg (ref 26.0–34.0)
MCHC: 34.5 g/dL (ref 30.0–36.0)
MCV: 92.7 fL (ref 80.0–100.0)
Monocytes Absolute: 0.5 10*3/uL (ref 0.1–1.0)
Monocytes Relative: 4 %
Neutro Abs: 10.8 10*3/uL — ABNORMAL HIGH (ref 1.7–7.7)
Neutrophils Relative %: 87 %
Platelets: 188 10*3/uL (ref 150–400)
RBC: 4.38 MIL/uL (ref 4.22–5.81)
RDW: 11.9 % (ref 11.5–15.5)
WBC: 12.5 10*3/uL — ABNORMAL HIGH (ref 4.0–10.5)
nRBC: 0 % (ref 0.0–0.2)

## 2022-11-04 LAB — CBG MONITORING, ED: Glucose-Capillary: 94 mg/dL (ref 70–99)

## 2022-11-04 LAB — ETHANOL: Alcohol, Ethyl (B): <10 mg/dL (ref <10)

## 2022-11-04 MED ORDER — SODIUM CHLORIDE 0.9 % IV BOLUS
1000.0000 mL | Freq: Once | INTRAVENOUS | Status: AC
  Administered 2022-11-04: 1000 mL via INTRAVENOUS
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
## 2022-11-05 ENCOUNTER — Telehealth: Payer: Self-pay

## 2022-11-05 NOTE — Transitions of Care (Post Inpatient/ED Visit) (Signed)
   11/05/2022  Name: Jesse Schultz MRN: [id number] DOB: 2001/04/08  Today's TOC FU Call Status: Today's TOC FU Call Status:: Successful TOC FU Call Competed TOC FU Call Complete Date: 11/05/22  Transition Care Management Follow-up Telephone Call Date of Discharge: 11/04/22 Discharge [facility]: [facility]) Type of Discharge: Emergency Department Reason for ED Visit: Other: (Syncope and collapse) How have you been since you were released from the hospital?: Better Any questions or concerns?: No  Items Reviewed: Did you receive and understand the discharge instructions provided?: Yes Medications obtained and verified?: Yes (Medications Reviewed) Any new allergies since your discharge?: No Dietary orders reviewed?: NA Do you have support at home?: Yes  Home Care and Equipment/Supplies: [place] Ordered?: NA Any new equipment or medical supplies ordered?: NA  Functional Questionnaire: Do you need assistance with bathing/showering or dressing?: No Do you need assistance with meal preparation?: No Do you need assistance with eating?: No Do you have difficulty maintaining continence: No Do you need assistance with getting out of bed/getting out of a chair/moving?: No Do you have difficulty managing or taking your medications?: No  Folllow up appointments reviewed: PCP Follow-up appointment confirmed?: Yes Date of PCP follow-up appointment?: 11/11/22 Follow-up Provider: Alma Friendly NP [facility] Follow-up appointment confirmed?: No Do you need transportation to your follow-up appointment?: No Do you understand care options if your condition(s) worsen?: Yes-patient verbalized understanding    [place] LPN [place] Direct Dial [phone]

## 2022-11-10 ENCOUNTER — Ambulatory Visit (INDEPENDENT_AMBULATORY_CARE_PROVIDER_SITE_OTHER): Payer: Commercial Managed Care - PPO | Admitting: Family Medicine

## 2022-11-10 ENCOUNTER — Encounter: Payer: Self-pay | Admitting: Family Medicine

## 2022-11-10 VITALS — BP 102/80 | HR 72 | Temp 97.1°F | Ht 69.0 in | Wt 131.0 lb

## 2022-11-10 DIAGNOSIS — R55 Syncope and collapse: Secondary | ICD-10-CM | POA: Diagnosis not present

## 2022-11-10 MED ORDER — SERTRALINE HCL 50 MG PO TABS: 50.0000 mg | ORAL_TABLET | Freq: Every day | ORAL | Status: DC
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
## 2022-11-11 NOTE — Assessment & Plan Note (Signed)
Discussed options.  I would not expect that his psychiatric medications contributed but I asked him to talk to his psychiatrist in the meantime. Refer back to cardiology.  Referred to neurology.  Driving cautions given to patient.  Would not drive at this point.  He does not have typical seizure history but seizure cautions discussed with patient.  Will see about arranging for Zio patch in the meantime. Discussed adequate fluid intake and adding salt to his food. Vasovagal pathophysiology discussed with patient.  Routed to PCP as FYI.

## 2022-11-12 ENCOUNTER — Ambulatory Visit: Payer: Commercial Managed Care - PPO | Attending: Family Medicine

## 2022-11-12 ENCOUNTER — Telehealth: Payer: Self-pay | Admitting: *Deleted

## 2022-11-12 ENCOUNTER — Ambulatory Visit (INDEPENDENT_AMBULATORY_CARE_PROVIDER_SITE_OTHER): Payer: Commercial Managed Care - PPO

## 2022-11-12 ENCOUNTER — Encounter: Payer: Self-pay | Admitting: *Deleted

## 2022-11-12 ENCOUNTER — Other Ambulatory Visit: Payer: Self-pay | Admitting: *Deleted

## 2022-11-12 DIAGNOSIS — R55 Syncope and collapse: Secondary | ICD-10-CM

## 2022-11-12 NOTE — Telephone Encounter (Signed)
Patient and his mom came into office for heart monitor. Advised that we do not place monitors here in our office. Order was incorrectly entered so we placed new monitor order and reviewed the process with patient and his mother. She reports that provider mentioned him getting it today so they would have results back next week for his appointment. Advised that results would not be ready by that time. Discussed that wear time is 7 days then it gets mailed back to company and then they download and compile report which can sometimes take up to a week for processing. Will call company to send them monitor overnight and they were appreciative for the assistance. No further needs at this time.

## 2022-11-16 ENCOUNTER — Ambulatory Visit (INDEPENDENT_AMBULATORY_CARE_PROVIDER_SITE_OTHER): Payer: Commercial Managed Care - PPO | Admitting: Neurology

## 2022-11-16 ENCOUNTER — Encounter: Payer: Self-pay | Admitting: Neurology

## 2022-11-16 VITALS — BP 121/63 | HR 78 | Ht 69.0 in | Wt 131.5 lb

## 2022-11-16 DIAGNOSIS — R55 Syncope and collapse: Secondary | ICD-10-CM
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
## 2022-11-16 NOTE — Patient Instructions (Signed)
Routine EEG No driving until completion of all testing Follow-up as needed

## 2022-11-20 ENCOUNTER — Ambulatory Visit: Payer: Commercial Managed Care - PPO | Attending: Internal Medicine | Admitting: Internal Medicine

## 2022-11-20 ENCOUNTER — Encounter: Payer: Self-pay | Admitting: Internal Medicine

## 2022-11-20 VITALS — BP 90/50 | HR 58 | Ht 69.0 in | Wt 132.0 lb

## 2022-11-20 DIAGNOSIS — R55 Syncope and collapse: Secondary | ICD-10-CM | POA: Diagnosis not present

## 2022-11-20 NOTE — Patient Instructions (Signed)
Medication Instructions:   Your physician recommends that you continue on your current medications as directed. Please refer to the Current Medication list given to you today.  *If you need a refill on your cardiac medications before your next appointment, please call your pharmacy*  Lab Work: NONE ordered at this time of appointment   If you have labs (blood work) drawn today and your tests are completely normal, you will receive your results only by: [place] (if you have MyChart) OR A paper copy in the mail If you have any lab test that is abnormal or we need to change your treatment, we will call you to review the results.  Testing/Procedures: NONE ordered at this time of appointment   Follow-Up: At [facility], you and your health needs are our priority.  As part of our continuing mission to provide you with exceptional heart care, we have created designated Provider Care Teams.  These Care Teams include your primary Cardiologist (physician) and Advanced Practice Providers (APPs -  Physician Assistants and Nurse Practitioners) who all work together to provide you with the care you need, when you need it.    Your next appointment:   6 month(s)  Provider:   Janina Mayo, MD     Other Instructions
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
## 2022-11-23 ENCOUNTER — Ambulatory Visit (INDEPENDENT_AMBULATORY_CARE_PROVIDER_SITE_OTHER): Payer: Commercial Managed Care - PPO | Admitting: Neurology

## 2022-11-23 DIAGNOSIS — R55 Syncope and collapse: Secondary | ICD-10-CM | POA: Diagnosis not present

## 2022-11-23 NOTE — Procedures (Signed)
    History:  22 year old man with seizure vs. Syncope   EEG classification:  Awake and asleep  Description of the recording: The background rhythms of this recording consists of a fairly well modulated medium amplitude background activity of 9-10 Hz. As the record progresses, the patient initially is in the waking state, but appears to enter the early stage II sleep during the recording, with rudimentary sleep spindles and vertex sharp wave activity seen. During the wakeful state, photic stimulation is performed, and no abnormal responses were seen. Hyperventilation was also performed, no abnormal response seen. No epileptiform discharges seen during this recording. There was no focal slowing.   Abnormality: None   Impression: This is a normal EEG recording in the waking and sleeping state. No evidence of interictal epileptiform discharges. Normal EEGs, however, do not rule out epilepsy.    Alric Ran, MD [facility]

## 2022-11-30 ENCOUNTER — Encounter: Payer: Self-pay | Admitting: Family Medicine

## 2022-11-30 ENCOUNTER — Encounter: Payer: Self-pay | Admitting: Internal Medicine

## 2022-12-02 ENCOUNTER — Telehealth: Payer: Self-pay | Admitting: Primary Care

## 2022-12-02 ENCOUNTER — Telehealth: Payer: Self-pay | Admitting: Internal Medicine

## 2022-12-02 NOTE — Telephone Encounter (Signed)
Patient dropped off document DMV, to be filled out by provider. Patient requested to send it via Call Patient to pick up within ASAP. Document is located in providers tray at front office.Please advise at Mobile [phone] (mobile).

## 2022-12-02 NOTE — Telephone Encounter (Signed)
Form completed. Placed in [place].

## 2022-12-02 NOTE — Telephone Encounter (Signed)
I will defer to PCP.  °

## 2022-12-02 NOTE — Telephone Encounter (Signed)
Paper Work Dropped Off: [place] [employer]   Date: 12/02/2022  Location of paper:  give to nurse JB

## 2022-12-02 NOTE — Telephone Encounter (Signed)
Please see MyChart encounter. Cardiac portion of paper work filled out and left at front desk for patient to pick up.

## 2022-12-02 NOTE — Telephone Encounter (Signed)
Form placed on [place] desk for review. See MyChart encounter for further documentation regarding this.

## 2022-12-03 NOTE — Telephone Encounter (Signed)
Patient has been notified, form is ready for pickup.

## 2022-12-03 NOTE — Telephone Encounter (Signed)
Completed and placed in Kelli's inbox. 

## 2023-02-15 ENCOUNTER — Ambulatory Visit: Payer: Commercial Managed Care - PPO | Admitting: Neurology

## 2023-02-16 ENCOUNTER — Ambulatory Visit: Payer: Commercial Managed Care - PPO | Admitting: Neurology

## 2023-02-26 ENCOUNTER — Institutional Professional Consult (permissible substitution): Payer: Commercial Managed Care - PPO | Admitting: Cardiology

## 2023-04-19 ENCOUNTER — Telehealth: Payer: Self-pay | Admitting: *Deleted

## 2023-04-19 NOTE — Telephone Encounter (Signed)
Pt dvm form in medical records not able to fill out until 05/19/23

## 2023-05-19 ENCOUNTER — Telehealth: Payer: Self-pay

## 2023-05-19 NOTE — Telephone Encounter (Signed)
FMLA paperwork completed and waiting MD signature

## 2023-05-20 NOTE — Telephone Encounter (Signed)
REQUIRED PHONE NOTE:Pt states he was returning a call, the message from April, RN was relayed to him.  Pt will call back later today to see if the MD has signed off on the paperwork.

## 2023-05-21 NOTE — Telephone Encounter (Signed)
Pt called to check on the status of paperwork

## 2023-05-24 ENCOUNTER — Encounter: Payer: Self-pay | Admitting: Internal Medicine

## 2023-05-24 ENCOUNTER — Ambulatory Visit: Payer: Commercial Managed Care - PPO | Attending: Internal Medicine | Admitting: Internal Medicine

## 2023-05-24 VITALS — BP 96/60 | HR 73 | Ht 69.0 in | Wt 149.4 lb

## 2023-05-24 DIAGNOSIS — I471 Supraventricular tachycardia, unspecified: Secondary | ICD-10-CM | POA: Diagnosis not present

## 2023-05-24 DIAGNOSIS — R55 Syncope and collapse: Secondary | ICD-10-CM | POA: Diagnosis not present

## 2023-05-24 NOTE — Telephone Encounter (Signed)
Pt checking if DMV form has been fillout

## 2023-05-24 NOTE — Telephone Encounter (Signed)
Please review. Thanks!
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
## 2023-05-24 NOTE — Patient Instructions (Addendum)
Medication Instructions:  Your physician recommends that you continue on your current medications as directed. Please refer to the Current Medication list given to you today.  *If you need a refill on your cardiac medications before your next appointment, please call your pharmacy*  Lab Work: If you have labs (blood work) drawn today and your tests are completely normal, you will receive your results only by: MyChart Message (if you have MyChart) OR A paper copy in the mail If you have any lab test that is abnormal or we need to change your treatment, we will call you to review the results.  Testing/Procedures: None ordered today.  Follow-Up: At [facility], you and your health needs are our priority.  As part of our continuing mission to provide you with exceptional heart care, we have created designated Provider Care Teams.  These Care Teams include your primary Cardiologist (physician) and Advanced Practice Providers (APPs -  Physician Assistants and Nurse Practitioners) who all work together to provide you with the care you need, when you need it.  We recommend signing up for the patient portal called "MyChart".  Sign up information is provided on this After Visit Summary.  MyChart is used to connect with patients for Virtual Visits (Telemedicine).  Patients are able to view lab/test results, encounter notes, upcoming appointments, etc.  Non-urgent messages can be sent to your provider as well.   To learn more about what you can do with MyChart, go to [email or web address].    Your next appointment:   As needed  Provider:   Maisie Fus, MD

## 2023-05-26 NOTE — Telephone Encounter (Signed)
[facility] DMV forms was faxed to on 05/20/23

## 2023-06-30 ENCOUNTER — Encounter (HOSPITAL_BASED_OUTPATIENT_CLINIC_OR_DEPARTMENT_OTHER): Payer: Self-pay | Admitting: Emergency Medicine

## 2023-06-30 ENCOUNTER — Emergency Department (HOSPITAL_BASED_OUTPATIENT_CLINIC_OR_DEPARTMENT_OTHER)
Admission: EM | Admit: 2023-06-30 | Discharge: 2023-06-30 | Disposition: A | Discharge status: Home / Self Care | Payer: Commercial Managed Care - PPO | Attending: Emergency Medicine | Admitting: Emergency Medicine

## 2023-06-30 ENCOUNTER — Other Ambulatory Visit: Payer: Self-pay

## 2023-06-30 DIAGNOSIS — R569 Unspecified convulsions: Secondary | ICD-10-CM | POA: Diagnosis not present

## 2023-06-30 DIAGNOSIS — R55 Syncope and collapse: Secondary | ICD-10-CM | POA: Diagnosis present

## 2023-06-30 DIAGNOSIS — R112 Nausea with vomiting, unspecified: Secondary | ICD-10-CM | POA: Diagnosis not present

## 2023-06-30 LAB — BASIC METABOLIC PANEL
Anion gap: 8 (ref 5–15)
BUN: 11 mg/dL (ref 6–20)
CO2: 28 mmol/L (ref 22–32)
Calcium: 9.4 mg/dL (ref 8.9–10.3)
Chloride: 102 mmol/L (ref 98–111)
Creatinine, Ser: 0.82 mg/dL (ref 0.61–1.24)
GFR, Estimated: >60 mL/min (ref >60)
Glucose, Bld: 120 mg/dL — ABNORMAL HIGH (ref 70–99)
Potassium: 3.5 mmol/L (ref 3.5–5.1)
Sodium: 138 mmol/L (ref 135–145)

## 2023-06-30 LAB — CBC WITH DIFFERENTIAL/PLATELET
Abs Immature Granulocytes: 0.05 10*3/uL (ref 0.00–0.07)
Basophils Absolute: 0 10*3/uL (ref 0.0–0.1)
Basophils Relative: 0 %
Eosinophils Absolute: 0.2 10*3/uL (ref 0.0–0.5)
Eosinophils Relative: 2 %
HCT: 41.8 % (ref 39.0–52.0)
Hemoglobin: 14.9 g/dL (ref 13.0–17.0)
Immature Granulocytes: 1 %
Lymphocytes Relative: 30 %
Lymphs Abs: 2.7 10*3/uL (ref 0.7–4.0)
MCH: 32 pg (ref 26.0–34.0)
MCHC: 35.6 g/dL (ref 30.0–36.0)
MCV: 89.7 fL (ref 80.0–100.0)
Monocytes Absolute: 0.5 10*3/uL (ref 0.1–1.0)
Monocytes Relative: 5 %
Neutro Abs: 5.5 10*3/uL (ref 1.7–7.7)
Neutrophils Relative %: 62 %
Platelets: 231 10*3/uL (ref 150–400)
RBC: 4.66 MIL/uL (ref 4.22–5.81)
RDW: 12.3 % (ref 11.5–15.5)
WBC: 8.9 10*3/uL (ref 4.0–10.5)
nRBC: 0 % (ref 0.0–0.2)

## 2023-06-30 MED ORDER — ONDANSETRON HCL 4 MG/2ML IJ SOLN
4.0000 mg | Freq: Once | INTRAMUSCULAR | Status: AC
  Administered 2023-06-30: 4 mg via INTRAVENOUS
  Filled 2023-06-30: qty 2

## 2023-06-30 NOTE — ED Triage Notes (Signed)
Pt was at working, arms became rigid/ shaking and "our of it" for about a minute. Pt was seen by coworker, has had prior episodes with no diagnosis. Pt just vomited 3 full bags of our emesis bags. Prior pt felt lightheaded.

## 2023-06-30 NOTE — ED Notes (Addendum)
Pt ambulated in hall. Pt had steady gait, denies dizziness, lightheadedness; reported he felt baseline. Pt tolerating PO challenge well; denies nausea. EDP notified.

## 2023-06-30 NOTE — ED Provider Notes (Signed)
[name] EM[facility] DEPARTMENT AT [facility] Provider Note   CSN: [id number] Arrival date & time: 06/30/23  1149     History  Chief Complaint  Patient presents with   Seizures    Jesse Schultz is a 22 y.o. male.  Patient reports he was at work and felt very lightheaded.  Patient reports that he passed out.  Patient has a past medical history of multiple previous episodes of the same.  Patient has been evaluated by cardiology and neurology..  Patient has been diagnosed with vasovagal syncope.  Patient is here with his mother who reports patient has had similar syncopal episodes in the past.  Patient reports that he had some nausea and vomited after having syncope.  Patient reports he normally does not vomit.  Patient denies any headache he denies any chest pain denies any abdominal pain.  Mother reports he is acting normally  The history is provided by the patient. No language interpreter was used.  Seizures      Home Medications Prior to Admission medications   Medication Sig Start Date End Date Taking? Authorizing Provider  ARIPiprazole (ABILIFY) 5 MG tablet Take 5 mg by mouth daily. 06/28/23  Yes [provider]  ARIPiprazole (ABILIFY) 10 MG tablet Take 1 tablet (10 mg total) by mouth daily. 10/22/20   Antonieta Pert, MD  hydrOXYzine (ATARAX/VISTARIL) 25 MG tablet Take 1 tablet (25 mg total) by mouth 3 (three) times daily as needed for anxiety. 10/21/20   Antonieta Pert, MD  loratadine (CLARITIN) 10 MG tablet Take 10 mg by mouth daily. 10/29/16   [provider]  sertraline (ZOLOFT) 50 MG tablet Take 1 tablet (50 mg total) by mouth daily. 11/10/22   Joaquim Nam, MD      Allergies    Other    Review of Systems   Review of Systems  Gastrointestinal:  Positive for nausea and vomiting.  Neurological:  Negative for seizures.  All other systems reviewed and are negative.   Physical Exam Updated Vital Signs BP 116/61   Pulse 72    Temp 98 F (36.7 C)   Resp 11   Wt 63.5 kg   SpO2 99%   BMI 20.67 kg/m  Physical Exam Vitals and nursing note reviewed.  Constitutional:      Appearance: He is well-developed.  HENT:     Head: Normocephalic.  Cardiovascular:     Rate and Rhythm: Normal rate.  Pulmonary:     Effort: Pulmonary effort is normal.  Abdominal:     General: There is no distension.  Musculoskeletal:        General: Normal range of motion.     Cervical back: Normal range of motion.  Skin:    General: Skin is warm.  Neurological:     General: No focal deficit present.     Mental Status: He is alert and oriented to person, place, and time.     ED Results / Procedures / Treatments   Labs (all labs ordered are listed, but only abnormal results are displayed) Labs Reviewed  BASIC METABOLIC PANEL - Abnormal; Notable for the following components:      Result Value   Glucose, Bld 120 (*)    All other components within normal limits  CBC WITH DIFFERENTIAL/PLATELET    EKG None  Radiology No results found.  Procedures Procedures    Medications Ordered in ED Medications  ondansetron (ZOFRAN) injection 4 mg (4 mg Intravenous Given 06/30/23 1305)  ED Course/ Medical Decision Making/ A&P                                 Medical Decision Making Pt complains of having a syncopal episode.  Pt reports vomiting twice afterwards.   Amount and/or Complexity of Data Reviewed Independent Historian: parent    Details: Pt here with Mother who is supportive  External Data Reviewed: notes.    Details: Cardiology and Neurology notes reviewed.  Labs: ordered.    Details: Labs ordered reviewed and interpreted.  Normal cbc and normal chemistry   Risk Prescription drug management. Risk Details: Pt given zofran odt.  Pt able to tolerate fluids.  Pt feels well and wants to go home            Final Clinical Impression(s) / ED Diagnoses Final diagnoses:  Syncope, unspecified syncope type     Rx / DC Orders ED Discharge Orders     None      An After Visit Summary was printed and given to the patient.    Elson Areas, PA-C 06/30/23 1521    Blane Ohara, MD 07/05/23 [phone]

## 2023-06-30 NOTE — ED Notes (Signed)
Seizure precautions initiated. Pt alert to voice, lethargic, reports he feels 'different' from baseline, pt follow commands. GCS 15. Mother bedside. Pt placed on monitor.

## 2023-07-05 ENCOUNTER — Telehealth: Payer: Self-pay

## 2023-07-05 NOTE — Transitions of Care (Post Inpatient/ED Visit) (Signed)
pt said was seen [facility] ED on 06/30/23;pt was nauseated, lightheaded and pt felt like he had a seizure. pt felt rigid. pt did lose consciousness. Pt is feeling better since seen in ED. Pt has no hx of seizures. Pt already has appt with Allayne Gitelman NP on 07/06/23 at 9:20 with UC & ED precautions given and pt voiced understanding.sending note to [facility] NP.          07/05/2023  Name: Jesse Schultz MRN: [id number] DOB: [birth year]  Today's TOC FU Call Status: Today's TOC FU Call Status:: Successful TOC FU Call Completed TOC FU Call Complete Date: 07/05/23 Patient's Name and Date of Birth confirmed.  Transition Care Management Follow-up Telephone Call Date of Discharge: 06/30/23 Discharge Facility: [facility] ([facility]) Type of Discharge: Emergency Department Reason for ED Visit: Other: (pt said was seen [facility] ED on 06/30/23;pt was nauseated, lightheaded and pt felt like he had a seizure. pt felt rigid. pt did lose consciousness.) How have you been since you were released from the hospital?: Better Any questions or concerns?: No  Items Reviewed: Did you receive and understand the discharge instructions provided?: Yes Medications obtained,verified, and reconciled?: Yes (Medications Reviewed) Any new allergies since your discharge?: No Dietary orders reviewed?: NA Do you have support at home?: Yes People in Home: parent(s) Name of Support/Comfort Primary Source: donna  Medications Reviewed Today: Medications Reviewed Today     Reviewed by Patience Musca, LPN (Licensed Practical Nurse) on 07/05/23 at [phone]  Med List Status: <None>   Medication Order Taking? Sig Documenting Provider Last Dose Status Informant  ARIPiprazole (ABILIFY) 10 MG tablet [id number] No Take 1 tablet (10 mg total) by mouth daily. Antonieta Pert, MD Taking Active   ARIPiprazole (ABILIFY) 5 MG tablet [id number]  Take 5 mg by mouth daily. [provider]  Active   hydrOXYzine  (ATARAX/VISTARIL) 25 MG tablet [id number] No Take 1 tablet (25 mg total) by mouth 3 (three) times daily as needed for anxiety. Antonieta Pert, MD Taking Active   loratadine (CLARITIN) 10 MG tablet [id number] No Take 10 mg by mouth daily. [provider] Taking Active   sertraline (ZOLOFT) 50 MG tablet [id number] No Take 1 tablet (50 mg total) by mouth daily. Joaquim Nam, MD Taking Active             Home Care and Equipment/Supplies: Were Home Health Services Ordered?: NA Any new equipment or medical supplies ordered?: NA  Functional Questionnaire: Do you need assistance with bathing/showering or dressing?: No Do you need assistance with meal preparation?: No Do you need assistance with eating?: No Do you have difficulty maintaining continence: No Do you need assistance with getting out of bed/getting out of a chair/moving?: No Do you have difficulty managing or taking your medications?: No  Follow up appointments reviewed: PCP Follow-up appointment confirmed?: Yes Date of PCP follow-up appointment?: 07/06/23 Follow-up Provider: Allayne Gitelman NP [facility]ist [facility] Follow-up appointment confirmed?: NA Do you need transportation to your follow-up appointment?: No Do you understand care options if your condition(s) worsen?: Yes-patient verbalized understanding    SIGNATURE Lewanda Rife, LPN

## 2023-07-05 NOTE — Telephone Encounter (Signed)
Noted, will evaluate. 

## 2023-07-06 ENCOUNTER — Ambulatory Visit (INDEPENDENT_AMBULATORY_CARE_PROVIDER_SITE_OTHER): Payer: Commercial Managed Care - PPO | Admitting: Primary Care

## 2023-07-06 ENCOUNTER — Encounter: Payer: Self-pay | Admitting: Primary Care

## 2023-07-06 VITALS — BP 118/60 | HR 90 | Temp 97.9°F | Ht 69.0 in | Wt 145.0 lb

## 2023-07-06 DIAGNOSIS — M2480 Other specific joint derangements of unspecified joint, not elsewhere classified: Secondary | ICD-10-CM | POA: Diagnosis not present

## 2023-07-06 DIAGNOSIS — R55 Syncope and collapse: Secondary | ICD-10-CM

## 2023-07-06 NOTE — Assessment & Plan Note (Addendum)
Ongoing, unclear etiology.  Instructed patient to reach back out to Cardiology for follow-up appointment.   Echocardiogram ordered as last was done in 2018.  Bilateral carotid US ordered as part of Ehlers-Danlos work-up.   Discussed continuing proper hydration and adequate sodium intake.   Schedule annual physical for 3 months.   I evaluated patient, was consulted regarding treatment, and agree with assessment and plan per Tenna Delaine, RN, DNP student.   Mayra Reel, NP-C
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
## 2023-07-06 NOTE — Progress Notes (Signed)
Subjective:    Patient ID: Jesse Schultz, male    DOB: May 08, 2001, 22 y.o.   MRN: [id number]  HPI  Jesse Schultz is a very pleasant 22 y.o. male with a history of syncope, pectus excavated him, ADHD, MDD, anxiety who presents today for emergency department follow-up.  He presented to [facility] on 06/30/2023 for syncope without nausea or vomiting.  During his stay in the ED his labs were grossly normal without anemia, electrolyte or abnormalities.  His EKG did not demonstrate a cardiac event.  He was treated with Zofran and oral fluids and discharged home later that evening.  Chronic history of syncope which dates back years ago.  Evaluated by neurology in 2024, underwent EEG which was negative.  Evaluated by cardiology in 2024, completed 14-day cardiac monitor which revealed minimal SVT while asymptomatic.  Most recent cardiology visit was in September 2024, no syncopal episodes between late February and that visit.  He was told to follow-up as needed.  Today he discusses that he felt lightheaded while at work, "felt static" in the back of his head. His boss saw him get shaky and weak, thought he had a seizure so his mother took him to the ED. He has another appointment with neurology scheduled for early 2025, does not have a cardiology appointment scheduled. He questions if he has POTS given symptoms of dizziness with standing. No episodes since his ED visit. He hydrates well with water and gatorade.   His last echocardiogram was in 2018, normal study. History of hypermobility and pectus excavatum with surgical repair in 2021. Evaluated for hypermobility joint disorder in 2018 due to pectus excavatum and syncope. He Was deemed not to have hypermobility disorder given normal echocardiogram. No genetic testing was completed. He is interested in pursing further work up.  BP Readings from Last 3 Encounters:  07/06/23 118/60  06/30/23 116/61  05/24/23 96/60      Review of Systems   Respiratory:  Negative for shortness of breath.   Cardiovascular:  Negative for chest pain.  Gastrointestinal:  Negative for nausea and vomiting.  Neurological:  Negative for dizziness and syncope.         Past Medical History:  Diagnosis Date   Allergy    Anxiety    Asthma    Depression    Fainting spell    GERD (gastroesophageal reflux disease)     Social History   Socioeconomic History   Marital status: Single    Spouse name: Not on file   Number of children: 0   Years of education: Not on file   Highest education level: High school graduate  Occupational History   Not on file  Tobacco Use   Smoking status: Never   Smokeless tobacco: Never  Vaping Use   Vaping status: Never Used  Substance and Sexual Activity   Alcohol use: Not Currently    Comment: 2-3 beers weekly   Drug use: No   Sexual activity: Yes    Birth control/protection: None  Other Topics Concern   Not on file  Social History Narrative   Brently is in ninth grade at [employer]. He is meeting most of the goals on his IEP.He struggles with math.   Elgene lives with his parents, and two brothers.   Social Determinants of Health   Financial Resource Strain: Not on file  Food Insecurity: Not on file  Transportation Needs: Not on file  Physical Activity: Not on file  Stress: Not  on file  Social Connections: Not on file  Intimate Partner Violence: Not on file    Past Surgical History:  Procedure Laterality Date   ADENOIDECTOMY AND MYRINGOTOMY WITH TUBE PLACEMENT     CIRCUMCISION     PECTUS BAR INSERTION PEDIATRIC  2021   TYMPANOPLASTY     TYMPANOSTOMY TUBE PLACEMENT     WISDOM TOOTH EXTRACTION      Family History  Problem Relation Age of Onset   Allergies Mother    Asthma Maternal Grandmother    Allergies Maternal Grandmother    Heart disease Maternal Grandfather     Allergies  Allergen Reactions   Other Cough    Seasonal Allergies in Spring & Fall  Seasonal Allergies  in Spring & Fall    Current Outpatient Medications on File Prior to Visit  Medication Sig Dispense Refill   ARIPiprazole (ABILIFY) 10 MG tablet Take 1 tablet (10 mg total) by mouth daily. 30 tablet 0   ARIPiprazole (ABILIFY) 5 MG tablet Take 5 mg by mouth daily.     hydrOXYzine (ATARAX/VISTARIL) 25 MG tablet Take 1 tablet (25 mg total) by mouth 3 (three) times daily as needed for anxiety. 30 tablet 0   loratadine (CLARITIN) 10 MG tablet Take 10 mg by mouth daily.     sertraline (ZOLOFT) 50 MG tablet Take 1 tablet (50 mg total) by mouth daily.     No current facility-administered medications on file prior to visit.    BP 118/60   Pulse 90   Temp 97.9 F (36.6 C) (Temporal)   Ht 5\' 9"  (1.753 m)   Wt 145 lb (65.8 kg)   SpO2 98%   BMI 21.41 kg/m  Objective:   Physical Exam Cardiovascular:     Rate and Rhythm: Normal rate and regular rhythm.  Pulmonary:     Effort: Pulmonary effort is normal.     Breath sounds: Normal breath sounds.  Musculoskeletal:     Cervical back: Neck supple.  Skin:    General: Skin is warm and dry.  Neurological:     Mental Status: He is alert and oriented to person, place, and time.  Psychiatric:        Mood and Affect: Mood normal.           Assessment & Plan:  Syncope, unspecified syncope type Assessment & Plan: Ongoing, unclear etiology.  Instructed patient to reach back out to Cardiology for follow-up appointment.   Echocardiogram ordered as last was done in 2018.  Bilateral carotid US ordered as part of Ehlers-Danlos work-up.   Discussed continuing proper hydration and adequate sodium intake.   Schedule annual physical for 3 months.   I evaluated patient, was consulted regarding treatment, and agree with assessment and plan per Tenna Delaine, RN, DNP student.   Mayra Reel, NP-C   Orders: -     US Carotid Bilateral; Future -     ECHOCARDIOGRAM COMPLETE; Future  Generalized hypermobility of joints Assessment &  Plan: Further work up pending. Echocardiogram and carotid US ordered and pending.   Orders: -     US Carotid Bilateral; Future -     ECHOCARDIOGRAM COMPLETE; Future        Doreene Nest, NP

## 2023-07-06 NOTE — Patient Instructions (Signed)
We ordered an echocardiogram (ECHO) and carotid ultrasound. You will get a phone call about scheduling these.   Call your Cardiologist and schedule a follow-up appointment.  Schedule your annual physical for 3 months.   It was a pleasure to see you today!

## 2023-07-06 NOTE — Assessment & Plan Note (Signed)
Further work up pending. Echocardiogram and carotid US ordered and pending.

## 2023-07-28 ENCOUNTER — Other Ambulatory Visit: Payer: Self-pay

## 2023-07-28 ENCOUNTER — Telehealth: Payer: Self-pay | Admitting: *Deleted

## 2023-07-28 ENCOUNTER — Ambulatory Visit: Payer: Commercial Managed Care - PPO | Admitting: Internal Medicine

## 2023-07-28 DIAGNOSIS — R55 Syncope and collapse: Secondary | ICD-10-CM

## 2023-07-28 NOTE — Telephone Encounter (Signed)
Late entry from 07/28/23 at 3:20pm:  Spoke with pt to discuss his appointment with Dr. Wyline Mood that is scheduled for today at 4:20pm. Pt confirms that he is not experiencing new symptoms. He is still awaiting an echo (scheduled for 08/04/23). Dr. Wyline Mood had planned pt to EP to workup for suspected POTS and did not feel that visit today was necessary if pt not experiencing new symptoms. Pt verbalized agreement with this plan and wishes to cancel appointment today. Referral placed by Myriam Jacobson, LPN.  EP consult has been scheduled with Dr. Graciela Husbands in [place] on 10/07/23.

## 2023-08-04 ENCOUNTER — Ambulatory Visit (HOSPITAL_COMMUNITY)
Admission: RE | Admit: 2023-08-04 | Discharge: 2023-08-04 | Disposition: A | Discharge status: Home / Self Care | Payer: Commercial Managed Care - PPO | Source: Ambulatory Visit | Attending: Primary Care | Admitting: Primary Care

## 2023-08-04 DIAGNOSIS — R55 Syncope and collapse: Secondary | ICD-10-CM | POA: Diagnosis present

## 2023-08-04 DIAGNOSIS — M2480 Other specific joint derangements of unspecified joint, not elsewhere classified: Secondary | ICD-10-CM | POA: Diagnosis not present

## 2023-08-04 LAB — ECHOCARDIOGRAM COMPLETE
AR max vel: 2.83 cm2
AV Peak grad: 8.2 mm[Hg]
Ao pk vel: 1.43 m/s
Area-P 1/2: 4.68 cm2
S' Lateral: 2.7 cm

## 2023-08-04 NOTE — Progress Notes (Signed)
Echocardiogram 2D Echocardiogram has been performed.  Jesse Schultz 08/04/2023, 2:33 PM

## 2023-10-04 ENCOUNTER — Telehealth: Payer: Self-pay | Admitting: Neurology

## 2023-10-04 ENCOUNTER — Ambulatory Visit: Payer: Commercial Managed Care - PPO | Admitting: Neurology

## 2023-10-04 DIAGNOSIS — Z0289 Encounter for other administrative examinations: Secondary | ICD-10-CM

## 2023-10-04 NOTE — Telephone Encounter (Signed)
Pt cancelled appt due to feeling sick. Informed patient would need to pay the no show fee of $50 before rescheduling appointment. Transferred patient to Billing.

## 2023-10-05 ENCOUNTER — Encounter: Payer: Commercial Managed Care - PPO | Admitting: Primary Care

## 2023-10-07 ENCOUNTER — Ambulatory Visit: Payer: Commercial Managed Care - PPO | Admitting: Internal Medicine

## 2023-10-07 DIAGNOSIS — R55 Syncope and collapse: Secondary | ICD-10-CM

## 2023-10-08 ENCOUNTER — Encounter: Payer: Commercial Managed Care - PPO | Admitting: Primary Care

## 2023-10-13 ENCOUNTER — Encounter: Payer: Commercial Managed Care - PPO | Admitting: Primary Care

## 2023-10-26 ENCOUNTER — Encounter: Payer: Commercial Managed Care - PPO | Admitting: Primary Care

## 2023-10-27 ENCOUNTER — Encounter: Payer: Commercial Managed Care - PPO | Admitting: Primary Care

## 2023-11-03 ENCOUNTER — Encounter: Payer: Commercial Managed Care - PPO | Admitting: Primary Care

## 2023-11-05 ENCOUNTER — Ambulatory Visit (INDEPENDENT_AMBULATORY_CARE_PROVIDER_SITE_OTHER): Payer: Commercial Managed Care - PPO | Admitting: Primary Care

## 2023-11-05 VITALS — BP 122/68 | HR 96 | Temp 97.5°F | Ht 69.0 in | Wt 153.0 lb

## 2023-11-05 DIAGNOSIS — Q676 Pectus excavatum: Secondary | ICD-10-CM

## 2023-11-05 DIAGNOSIS — F900 Attention-deficit hyperactivity disorder, predominantly inattentive type: Secondary | ICD-10-CM | POA: Diagnosis not present

## 2023-11-05 DIAGNOSIS — F419 Anxiety disorder, unspecified: Secondary | ICD-10-CM

## 2023-11-05 DIAGNOSIS — Z0000 Encounter for general adult medical examination without abnormal findings: Secondary | ICD-10-CM | POA: Diagnosis not present

## 2023-11-05 DIAGNOSIS — R55 Syncope and collapse: Secondary | ICD-10-CM

## 2023-11-05 DIAGNOSIS — F32A Depression, unspecified: Secondary | ICD-10-CM

## 2023-11-05 NOTE — Patient Instructions (Signed)
 It was a pleasure to see you today!

## 2023-11-05 NOTE — Assessment & Plan Note (Signed)
 Controlled. Following with psychiatry.

## 2023-11-05 NOTE — Assessment & Plan Note (Signed)
 Controlled.  Following with psychiatry. Continue Abilify 15 mg daily, Zoloft 50 mg daily, hydroxyzine 10 mg as needed.

## 2023-11-05 NOTE — Assessment & Plan Note (Signed)
 No recent episodes.  Follow-up with cardiology as scheduled.

## 2023-11-05 NOTE — Assessment & Plan Note (Signed)
 Immunizations UTD. Influenza vaccine provided today.   Discussed the importance of a healthy diet and regular exercise in order for weight loss, and to reduce the risk of further co-morbidity.  Exam stable. Declines labs.  Follow up in 1 year for repeat physical.

## 2023-11-05 NOTE — Assessment & Plan Note (Signed)
 Stable post operatively.

## 2023-11-05 NOTE — Progress Notes (Signed)
 Subjective:    Patient ID: Jesse Schultz, male    DOB: 06-04-2001, 23 y.o.   MRN: [id number]  HPI  Jesse Schultz is a very pleasant 23 y.o. male who presents today for complete physical and follow up of chronic conditions.  Immunizations: -Tetanus: Completed in 2020 -Influenza: Influenza vaccine provided today.   Diet: Fair diet.  Exercise: No regular exercise. Some walking the dog  Eye exam: Completed last year ago Dental exam: Completed >1 year ago  BP Readings from Last 3 Encounters:  11/05/23 122/68  07/06/23 118/60  06/30/23 116/61        Review of Systems  Constitutional:  Negative for unexpected weight change.  HENT:  Negative for rhinorrhea.   Respiratory:  Negative for cough and shortness of breath.   Cardiovascular:  Negative for chest pain.  Gastrointestinal:  Negative for constipation and diarrhea.  Genitourinary:  Negative for difficulty urinating.  Musculoskeletal:  Negative for arthralgias and myalgias.  Skin:  Negative for rash.  Allergic/Immunologic: Negative for environmental allergies.  Neurological:  Negative for dizziness, numbness and headaches.  Psychiatric/Behavioral:  The patient is not nervous/anxious.          Past Medical History:  Diagnosis Date   Allergy    Anxiety    Asthma    Depression    Fainting spell    GERD (gastroesophageal reflux disease)    MDD (major depressive disorder), recurrent severe, without psychosis (HCC)    Severe recurrent major depression without psychotic features (HCC) 10/15/2020   Suicidal ideation     Social History   Socioeconomic History   Marital status: Single    Spouse name: Not on file   Number of children: 0   Years of education: Not on file   Highest education level: Some college, no degree  Occupational History   Not on file  Tobacco Use   Smoking status: Never   Smokeless tobacco: Never  Vaping Use   Vaping status: Never Used  Substance and Sexual Activity   Alcohol  use: Not Currently    Comment: 2-3 beers weekly   Drug use: No   Sexual activity: Yes    Birth control/protection: None  Other Topics Concern   Not on file  Social History Narrative   Jesse Schultz is in ninth grade at [employer]. He is meeting most of the goals on his IEP.He struggles with math.   Jesse Schultz lives with his parents, and two brothers.   Social Drivers of Health   Financial Resource Strain: Low Risk  (11/05/2023)   Overall Financial Resource Strain (CARDIA)    Difficulty of Paying Living Expenses: Not very hard  Food Insecurity: Food Insecurity Present (11/05/2023)   Hunger Vital Sign    Worried About Running Out of Food in the Last Year: Sometimes true    Ran Out of Food in the Last Year: Sometimes true  Transportation Needs: Unmet Transportation Needs (11/05/2023)   PRAPARE - Transportation    Lack of Transportation (Medical): Yes    Lack of Transportation (Non-Medical): Yes  Physical Activity: Insufficiently Active (11/05/2023)   Exercise Vital Sign    Days of Exercise per Week: 4 days    Minutes of Exercise per Session: 20 min  Stress: Stress Concern Present (11/05/2023)   [employer] of Occupational Health - Occupational Stress Questionnaire    Feeling of Stress : To some extent  Social Connections: Unknown (11/05/2023)   Social Connection and Isolation Panel [NHANES]    Frequency  of Communication with Friends and Family: More than three times a week    Frequency of Social Gatherings with Friends and Family: Once a week    Attends Religious Services: 1 to 4 times per year    Active Member of [employer] or Organizations: No    Attends Engineer, structural: Not on file    Marital Status: Patient declined  Catering manager Violence: Not on file    Past Surgical History:  Procedure Laterality Date   ADENOIDECTOMY AND MYRINGOTOMY WITH TUBE PLACEMENT     CIRCUMCISION     PECTUS BAR INSERTION PEDIATRIC  2021   TYMPANOPLASTY     TYMPANOSTOMY TUBE  PLACEMENT     WISDOM TOOTH EXTRACTION      Family History  Problem Relation Age of Onset   Allergies Mother    Asthma Maternal Grandmother    Allergies Maternal Grandmother    Heart disease Maternal Grandfather     Allergies  Allergen Reactions   Other Cough    Seasonal Allergies in Spring & Fall  Seasonal Allergies in Spring & Fall    Current Outpatient Medications on File Prior to Visit  Medication Sig Dispense Refill   ARIPiprazole (ABILIFY) 10 MG tablet Take 1 tablet (10 mg total) by mouth daily. 30 tablet 0   ARIPiprazole (ABILIFY) 5 MG tablet Take 5 mg by mouth daily.     hydrOXYzine (ATARAX/VISTARIL) 25 MG tablet Take 1 tablet (25 mg total) by mouth 3 (three) times daily as needed for anxiety. 30 tablet 0   loratadine (CLARITIN) 10 MG tablet Take 10 mg by mouth daily.     sertraline (ZOLOFT) 50 MG tablet Take 1 tablet (50 mg total) by mouth daily.     No current facility-administered medications on file prior to visit.    BP 122/68   Pulse 96   Temp (!) 97.5 F (36.4 C) (Temporal)   Ht 5\' 9"  (1.753 m)   Wt 153 lb (69.4 kg)   SpO2 97%   BMI 22.59 kg/m  Objective:   Physical Exam HENT:     Right Ear: Tympanic membrane and ear canal normal.     Left Ear: Tympanic membrane and ear canal normal.  Eyes:     Pupils: Pupils are equal, round, and reactive to light.  Cardiovascular:     Rate and Rhythm: Normal rate and regular rhythm.  Pulmonary:     Effort: Pulmonary effort is normal.     Breath sounds: Normal breath sounds.  Abdominal:     General: Bowel sounds are normal.     Palpations: Abdomen is soft.     Tenderness: There is no abdominal tenderness.  Musculoskeletal:        General: Normal range of motion.     Cervical back: Neck supple.  Skin:    General: Skin is warm and dry.  Neurological:     Mental Status: He is alert and oriented to person, place, and time.     Cranial Nerves: No cranial nerve deficit.     Deep Tendon Reflexes:     Reflex  Scores:      Patellar reflexes are 2+ on the right side and 2+ on the left side. Psychiatric:        Mood and Affect: Mood normal.           Assessment & Plan:  Pectus excavatum Assessment & Plan: Stable post operatively.      Anxiety and depression Assessment & Plan: Controlled.  Following with psychiatry. Continue Abilify 15 mg daily, Zoloft 50 mg daily, hydroxyzine 10 mg as needed.   Attention deficit hyperactivity disorder, inattentive type Assessment & Plan: Controlled. Following with psychiatry.   Preventative health care Assessment & Plan: Immunizations UTD. Influenza vaccine provided today.   Discussed the importance of a healthy diet and regular exercise in order for weight loss, and to reduce the risk of further co-morbidity.  Exam stable. Declines labs.  Follow up in 1 year for repeat physical.    Syncope, unspecified syncope type Assessment & Plan: No recent episodes.  Follow-up with cardiology as scheduled.         Doreene Nest, NP
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
## 2023-11-17 ENCOUNTER — Ambulatory Visit: Payer: Commercial Managed Care - PPO | Attending: Cardiology | Admitting: Cardiology

## 2023-11-17 ENCOUNTER — Encounter: Payer: Self-pay | Admitting: Cardiology

## 2023-11-17 VITALS — BP 110/70 | HR 79 | Ht 69.0 in | Wt 153.0 lb

## 2023-11-17 DIAGNOSIS — R55 Syncope and collapse: Secondary | ICD-10-CM | POA: Diagnosis not present

## 2023-11-17 NOTE — Patient Instructions (Signed)
 Medication Instructions:  Your physician recommends that you continue on your current medications as directed. Please refer to the Current Medication list given to you today.  *If you need a refill on your cardiac medications before your next appointment, please call your pharmacy*  Follow-Up: At [facility], you and your health needs are our priority.  As part of our continuing mission to provide you with exceptional heart care, we have created designated Provider Care Teams.  These Care Teams include your primary Cardiologist (physician) and Advanced Practice Providers (APPs -  Physician Assistants and Nurse Practitioners) who all work together to provide you with the care you need, when you need it.  Your next appointment:   As needed with Dr. Lalla Brothers  Other Instructions You have been referred to see a Neurologist

## 2023-11-17 NOTE — Addendum Note (Signed)
 Addended by: Frutoso Schatz on: 11/17/2023 02:32 PM   Modules accepted: Orders

## 2024-01-12 ENCOUNTER — Ambulatory Visit: Admitting: Neurology

## 2024-02-08 ENCOUNTER — Encounter: Payer: Self-pay | Admitting: Neurology

## 2024-02-08 ENCOUNTER — Ambulatory Visit (INDEPENDENT_AMBULATORY_CARE_PROVIDER_SITE_OTHER): Admitting: Neurology

## 2024-02-08 VITALS — BP 109/62 | HR 88 | Ht 69.0 in | Wt 149.0 lb

## 2024-02-08 DIAGNOSIS — R55 Syncope and collapse: Secondary | ICD-10-CM
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
## 2024-02-08 NOTE — Patient Instructions (Signed)
 Increase fluid intake  Continue to follow up with PCP  Return as needed

## 2024-05-23 ENCOUNTER — Ambulatory Visit (INDEPENDENT_AMBULATORY_CARE_PROVIDER_SITE_OTHER)
Admission: RE | Admit: 2024-05-23 | Discharge: 2024-05-23 | Disposition: A | Discharge status: Home / Self Care | Source: Ambulatory Visit | Attending: Primary Care | Admitting: Primary Care

## 2024-05-23 ENCOUNTER — Ambulatory Visit (INDEPENDENT_AMBULATORY_CARE_PROVIDER_SITE_OTHER): Admitting: Primary Care

## 2024-05-23 VITALS — BP 116/68 | HR 72 | Temp 98.2°F | Ht 69.0 in | Wt 150.0 lb

## 2024-05-23 DIAGNOSIS — Q676 Pectus excavatum: Secondary | ICD-10-CM

## 2024-05-23 DIAGNOSIS — F419 Anxiety disorder, unspecified: Secondary | ICD-10-CM

## 2024-05-23 DIAGNOSIS — R0789 Other chest pain: Secondary | ICD-10-CM | POA: Diagnosis not present

## 2024-05-23 DIAGNOSIS — F32A Depression, unspecified: Secondary | ICD-10-CM

## 2024-05-23 DIAGNOSIS — Z23 Encounter for immunization: Secondary | ICD-10-CM

## 2024-05-23 NOTE — Assessment & Plan Note (Addendum)
 Will refer to general surgery for evaluation and potential removal of hardware from surgery in 2021. Reviewed surgical notes and office notes from 2021 through [facility].  Will also update chest x-ray today.

## 2024-05-23 NOTE — Assessment & Plan Note (Signed)
Referral placed for psychiatry

## 2024-05-23 NOTE — Assessment & Plan Note (Signed)
 Most likely secondary to hardware from pectus excavatum surgery.  Review of systems was negative for anything else. Exam today benign.  Referral placed to general surgery for further evaluation. Will obtain plain films of the chest today.
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
## 2024-05-23 NOTE — Patient Instructions (Signed)
 Complete xray(s) prior to leaving today. I will notify you of your results once received.  You will either be contacted via phone regarding your referral to general surgery and psychiatry, or you may receive a letter on your MyChart portal from our referral team with instructions for scheduling an appointment. Please let us  know if you have not been contacted by anyone within two weeks.  It was a pleasure to see you today!

## 2024-05-26 ENCOUNTER — Ambulatory Visit: Payer: Self-pay | Admitting: Primary Care

## 2024-07-10 ENCOUNTER — Encounter (HOSPITAL_COMMUNITY): Payer: Self-pay | Admitting: Family

## 2024-07-10 ENCOUNTER — Ambulatory Visit (HOSPITAL_BASED_OUTPATIENT_CLINIC_OR_DEPARTMENT_OTHER): Payer: Self-pay | Admitting: Family

## 2024-07-10 ENCOUNTER — Other Ambulatory Visit: Payer: Self-pay

## 2024-07-10 VITALS — BP 120/74 | HR 73 | Ht 69.5 in | Wt 148.0 lb

## 2024-07-10 DIAGNOSIS — F419 Anxiety disorder, unspecified: Secondary | ICD-10-CM | POA: Diagnosis not present

## 2024-07-10 DIAGNOSIS — F32A Depression, unspecified: Secondary | ICD-10-CM

## 2024-07-10 MED ORDER — HYDROXYZINE HCL 25 MG PO TABS: 25.0000 mg | ORAL_TABLET | Freq: Three times a day (TID) | ORAL | 0 refills | Status: DC | PRN

## 2024-07-10 MED ORDER — SERTRALINE HCL 25 MG PO TABS: ORAL_TABLET | ORAL | 0 refills | Status: DC

## 2024-07-10 NOTE — Progress Notes (Signed)
 Psychiatric Initial Adult Assessment   Patient Identification: Jesse Schultz MRN:  [id number] Date of Evaluation:  07/10/2024 Referral Source: Primary care provider,  Gretta Chief Complaint: I need to be restarted on my medications.  Visit Diagnosis:    ICD-10-CM   1. Anxiety and depression  F41.9    F32.A       History of Present Illness:  Jesse Schultz 23 year old Caucasian male presents to establish care.  Seen and evaluated face-to-face by this provider.  Jesse Schultz has a documented history related to major depressive disorder,  generalized anxiety disorder and attention deficit disorder.  Stated he has been off his medications for quite some time.  Unable to recall the names of the medications.   My memory is bad.  Patient appears to be a poor historian very limited and guarded throughout this assessment.    Jesse Schultz reports he has been struggling with grief and loss as he lost his grandmother who would have been 40 this year.   I think that is what triggered my depression this time.  States he is currently followed by therapy services in [place], [place].    Jesse Schultz reports symptoms include depression, anxiety, irritability, decreased energy, sleep issues and obsessive thoughts.  States he has been struggling on and off with symptoms for over 5 years.  Reports medical history related to shortness of breath, epilepsy seizure disorder, dizziness and pectus exactum.  Reports he is currently followed by neurology  occasionally  Chart review last medication prescribed was Zoloft , Abilify  and hydroxyzine .  Discussed restarting medications he was agreeable to plan.  PHQ-9 11 GAD-7 12 - Family history:  Maybe distant relatives unknown diagnosis  No concerns related to suicidal or homicidal ideations.  States his last inpatient admission 2022 due to suicide attempt.  As documented has 3 previous suicide attempts in the past.   Denies that he is married, no children, and he  currently resides with his father.  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  Jesse Schultz reports 2 older siblings however does not appear to have a relationship with siblings as he states they have moved out of the family home and they have limited contact.  Reports occasional alcohol use weekends.  Jesse Schultz is sitting;he is alert/oriented x 3; calm/cooperative; and mood, flat and guarded with affect.  Limited engagement throughout this assessment.  Responses are short abrupt.  patient is speaking in a clear tone at moderate volume, and normal pace; with poor eye contact.  His thought process is coherent and relevant; There is no indication that he is currently responding to internal/external stimuli or experiencing delusional thought content.  Patient denies suicidal/self-harm/homicidal ideation, psychosis, and paranoia.  Patient has remained calm.  Inquired about previous IEP's in school or autism/IDD diagnosis?  Patient reports maybe   Associated Signs/Symptoms: Depression Symptoms:  depressed mood, insomnia, anxiety, (Hypo) Manic Symptoms:  Distractibility, Anxiety Symptoms:  Excessive Worry, Psychotic Symptoms:  Hallucinations: None PTSD Symptoms: NA  Past Psychiatric History: Currently has a history related to major depressive disorder recurrent, anxiety.  Previous prescribed Zoloft , Abilify  and hydroxyzine  for mood stabilization.  Has had 3 suicide attempts in the past.  Previously followed by psychiatrist Akintayo for medication management.   Previous Psychotropic Medications: Yes   Substance Abuse History in the last 12 months:  No.  Consequences of Substance Abuse: NA  Past Medical History:  Past Medical History:  Diagnosis Date   Allergy    Anxiety    Asthma  Depression    Fainting spell    GERD (gastroesophageal reflux disease)    MDD (major depressive disorder), recurrent severe, without psychosis (HCC)    Severe recurrent major depression without psychotic  features (HCC) 10/15/2020   Suicidal ideation     Past Surgical History:  Procedure Laterality Date   ADENOIDECTOMY AND MYRINGOTOMY WITH TUBE PLACEMENT     CIRCUMCISION     PECTUS BAR INSERTION PEDIATRIC  2021   TYMPANOPLASTY     TYMPANOSTOMY TUBE PLACEMENT     WISDOM TOOTH EXTRACTION      Family Psychiatric History:   Family History:  Family History  Problem Relation Age of Onset   Allergies Mother    Asthma Maternal Grandmother    Allergies Maternal Grandmother    Heart disease Maternal Grandfather     Social History:   Social History   Socioeconomic History   Marital status: Single    Spouse name: Not on file   Number of children: 0   Years of education: Not on file   Highest education level: Some college, no degree  Occupational History   Not on file  Tobacco Use   Smoking status: Never   Smokeless tobacco: Never  Vaping Use   Vaping status: Never Used  Substance and Sexual Activity   Alcohol use: Not Currently    Comment: 2-3 beers weekly   Drug use: No   Sexual activity: Yes    Birth control/protection: None  Other Topics Concern   Not on file  Social History Narrative   Jesse Schultz is in ninth grade at [employer]. He is meeting most of the goals on his IEP.He struggles with math.   Jesse Schultz lives with his parents, and two brothers.   Social Drivers of Health   Financial Resource Strain: Medium Risk (05/23/2024)   Overall Financial Resource Strain (CARDIA)    Difficulty of Paying Living Expenses: Somewhat hard  Food Insecurity: Food Insecurity Present (05/23/2024)   Hunger Vital Sign    Worried About Running Out of Food in the Last Year: Sometimes true    Ran Out of Food in the Last Year: Sometimes true  Transportation Needs: No Transportation Needs (05/23/2024)   PRAPARE - Administrator, Civil Service (Medical): No    Lack of Transportation (Non-Medical): No  Physical Activity: Sufficiently Active (05/23/2024)   Exercise Vital Sign     Days of Exercise per Week: 4 days    Minutes of Exercise per Session: 40 min  Stress: Stress Concern Present (05/23/2024)   Jesse Schultz of Occupational Health - Occupational Stress Questionnaire    Feeling of Stress: Rather much  Social Connections: Unknown (05/23/2024)   Social Connection and Isolation Panel    Frequency of Communication with Friends and Family: More than three times a week    Frequency of Social Gatherings with Friends and Family: Once a week    Attends Religious Services: Patient declined    Database Administrator or Organizations: No    Attends Engineer, Structural: Not on file    Marital Status: Patient declined    Additional Social History:   Allergies:   Allergies  Allergen Reactions   Other Cough    Seasonal Allergies in Spring & Fall  Seasonal Allergies in Spring & Fall    Metabolic Disorder Labs: Lab Results  Component Value Date   HGBA1C 5.0 10/14/2020   MPG 96.8 10/14/2020   No results found for: PROLACTIN Lab Results  Component  Value Date   CHOL 122 10/14/2020   TRIG 69 10/14/2020   HDL 45 10/14/2020   CHOLHDL 2.7 10/14/2020   VLDL 14 10/14/2020   LDLCALC 63 10/14/2020   Lab Results  Component Value Date   TSH 1.34 07/23/2021    Therapeutic Level Labs: No results found for: LITHIUM No results found for: CBMZ No results found for: VALPROATE  Current Medications: Current Outpatient Medications  Medication Sig Dispense Refill   ARIPiprazole  (ABILIFY ) 10 MG tablet Take 1 tablet (10 mg total) by mouth daily. (Patient not taking: Reported on 05/23/2024) 30 tablet 0   ARIPiprazole  (ABILIFY ) 5 MG tablet Take 5 mg by mouth daily. (Patient not taking: Reported on 05/23/2024)     hydrOXYzine  (ATARAX ) 25 MG tablet Take 1 tablet (25 mg total) by mouth 3 (three) times daily as needed for anxiety. 30 tablet 0   loratadine (CLARITIN) 10 MG tablet Take 10 mg by mouth daily. (Patient not taking: Reported on 05/23/2024)      sertraline  (ZOLOFT ) 25 MG tablet Take 1 tablet (25 mg total) by mouth daily for 7 days, THEN 2 tablets (50 mg total) daily. 60 tablet 0   No current facility-administered medications for this visit.    Musculoskeletal: Strength & Muscle Tone: within normal limits Gait & Station: normal Patient leans: N/A  Psychiatric Specialty Exam: Review of Systems  Psychiatric/Behavioral:  Positive for decreased concentration and sleep disturbance. The patient is nervous/anxious.   All other systems reviewed and are negative.   Blood pressure 120/74, pulse 73, height 5' 9.5 (1.765 m), weight 148 lb (67.1 kg).Body mass index is 21.54 kg/m.  General Appearance: Casual  Eye Contact:  Minimal  Speech:  Clear and Coherent  Volume:  Normal  Mood:  Anxious and Depressed  Affect:  Congruent  Thought Process:  Coherent  Orientation:  Full (Time, Place, and Person)  Thought Content:  Logical  Suicidal Thoughts:  No  Homicidal Thoughts:  No  Memory:  Immediate;   Good  Judgement:  Good  Insight:  Good  Psychomotor Activity:  Normal  Concentration:  Concentration: Good  Recall:  Good  Fund of Knowledge:Good  Language: Fair  Akathisia:  No  Handed:  Right  AIMS (if indicated):  not done  Assets:  Communication Skills Desire for Improvement  ADL's:  Intact  Cognition: WNL  Sleep:  Poor   Screenings: AIMS    Flowsheet Row [facility]  AIMS Total Score 0   AUDIT    Flowsheet Row Office Visit from 05/23/2024 in [facility] [place] Office Visit from 11/05/2023 [facility] Ad[facility] (Discharged[facility]  Alcohol Use Disorder Identification Test Final Score (AUDIT) 5  5  3    GAD-7    Flowsheet Row Office Visit from 05/23/2024 in [facility] Office Visit from 11/05/2023  in [facility] [place] Office Visit from 11/10/2022 in [facility] Office Visit from 07/15/2020 in [facility] [place] [facility]  Total GAD-7 Score 9 5 8 6    PHQ2-9    Flowsheet Row [facility] Office Visit from 05/23/2024 in [facility] [place] [facility] Office Visit from 11/05/2023 in [facility] [place] Office Visit from 07/06/2023 in  [facility] Visit from 11/10/2022 in [facility] at [place]  PHQ-2 Total Score 3 3 1  0 2  PHQ-9 Total Score 8 11 3 3 6    Flowsheet Row ED from 06/30/2023 in [facility] at [facility] ED from 11/04/2022 in [facility] at [facility] ED from 07/17/2021 in [facility] at [facility]  C-SSRS RISK CATEGORY No Risk No Risk No Risk    Assessment and Plan: Vian Fluegel  is a 23 year old Caucasian male presents to establish care.  Previously diagnosed with major depressive disorder and generalized anxiety disorder.  Patient appears to be a poor historian very limited and guarded throughout this assessment.  Stated  I cannot remember.   I am not good with medication names.  States he is interested in restarting medications to help with his mood as he reports experiencing depression after the passing of his grandmother.  Denied family history related to mental illness maybe distant relatives  He denied illicit drug use or substance abuse history.  Discussed restarting previous medication regimen.  Patient to follow-up 1 month for medication adherence/tolerability.  Collaboration of Care: Medication Management AEB restarted Zoloft  and hydroxyzine  for mood stabilization  Patient/Guardian was advised Release of Information must be  obtained prior to any record release in order to collaborate their care with an outside provider. Patient/Guardian was advised if they have not already done so to contact the registration department to sign all necessary forms in order for us  to release information regarding their care.   Consent: Patient/Guardian gives verbal consent for treatment and assignment of benefits for services provided during this visit. Patient/Guardian expressed understanding and agreed to proceed.   Staci LOISE Kerns, NP 11/3/20253:46 PM

## 2024-07-17 ENCOUNTER — Encounter: Payer: Self-pay | Admitting: *Deleted

## 2024-07-21 ENCOUNTER — Other Ambulatory Visit: Payer: Self-pay | Admitting: Primary Care

## 2024-07-21 DIAGNOSIS — Q676 Pectus excavatum: Secondary | ICD-10-CM

## 2024-07-26 ENCOUNTER — Other Ambulatory Visit: Payer: Self-pay | Admitting: Primary Care

## 2024-07-26 DIAGNOSIS — Q676 Pectus excavatum: Secondary | ICD-10-CM

## 2024-09-11 ENCOUNTER — Other Ambulatory Visit: Payer: Self-pay

## 2024-09-11 ENCOUNTER — Ambulatory Visit (HOSPITAL_BASED_OUTPATIENT_CLINIC_OR_DEPARTMENT_OTHER): Admitting: Family

## 2024-09-11 ENCOUNTER — Encounter (HOSPITAL_COMMUNITY): Payer: Self-pay | Admitting: Family

## 2024-09-11 VITALS — BP 110/72 | HR 86 | Ht 69.0 in | Wt 156.0 lb

## 2024-09-11 DIAGNOSIS — F32A Depression, unspecified: Secondary | ICD-10-CM

## 2024-09-11 DIAGNOSIS — F419 Anxiety disorder, unspecified: Secondary | ICD-10-CM | POA: Diagnosis not present

## 2024-09-11 MED ORDER — ARIPIPRAZOLE 5 MG PO TABS: 5.0000 mg | ORAL_TABLET | Freq: Every day | ORAL | 0 refills | Status: AC

## 2024-09-11 MED ORDER — ARIPIPRAZOLE 5 MG PO TABS: 5.0000 mg | ORAL_TABLET | Freq: Every day | ORAL | 0 refills | Status: DC

## 2024-09-11 MED ORDER — SERTRALINE HCL 50 MG PO TABS: 50.0000 mg | ORAL_TABLET | Freq: Every day | ORAL | 0 refills | Status: DC

## 2024-09-11 MED ORDER — HYDROXYZINE HCL 25 MG PO TABS: 25.0000 mg | ORAL_TABLET | Freq: Three times a day (TID) | ORAL | 0 refills | Status: AC | PRN

## 2024-09-11 MED ORDER — SERTRALINE HCL 50 MG PO TABS: 50.0000 mg | ORAL_TABLET | Freq: Every day | ORAL | 0 refills | Status: AC

## 2024-09-11 NOTE — Progress Notes (Signed)
 BH MD/PA/NP OP Progress Note  09/11/2024 4:01 PM Jesse Schultz  MRN:  [id number]  Chief Complaint:  Medication management    HPI: Jesse Schultz 24 year old male presents for medication management follow-up appointment.  Carries a diagnosis related to major depressive disorder and generalized anxiety disorder.  Per initial assessment patient was initiated on Zoloft  25 to 50 mg taper and continued Abilify  10 mg as patient was previously followed by neuropsychiatric behavioral health psychiatrist Akintayo.  He was seen and evaluated face-to-face by this provider.  States he has not been taking medications as directed.  The  holidays can be little fuzzy. unable to recall the last time he had medications.  Reports he is making effort to be more consistent with medication and therapy services.  Reports he has a follow-up appointment next week with his therapist.  Tj is denying suicidal or homicidal ideations.  Denies auditory visual hallucinations.  Reports some sleep disturbance however he attributes to work schedule.  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  States for the most part he enjoys what he does.  Rating his depression 4 out of 10 with 10 being the worst.  Overall, his mood is stabilized.  States the holidays was uneventful as he had to share this time with his mother and father as they are divorced.  Patient encouraged to follow-up with office when medication is not available for refill through pharmacy.  He appeared amendable to plan.  Will restart Zoloft  50 mg daily and Abilify  5 mg daily.  Patient to follow-up 2 to 3 months for medication adherence/tolerability.  Support, encouragement and reassurance was provided.  Visit Diagnosis:    ICD-10-CM   1. Anxiety and depression  F41.9    F32.A       Past Psychiatric History:  Currently has a history related to major depressive disorder recurrent, anxiety.  Previous prescribed Zoloft , Abilify  and hydroxyzine  for mood  stabilization.  Has had 3 suicide attempts in the past.  Previously followed by psychiatrist Akintayo for medication management.  Possibly IDD/autism spectrum disorder documented previous chart information.   Past Medical History:  Past Medical History:  Diagnosis Date   Allergy    Anxiety    Asthma    Depression    Fainting spell    GERD (gastroesophageal reflux disease)    MDD (major depressive disorder), recurrent severe, without psychosis (HCC)    Severe recurrent major depression without psychotic features (HCC) 10/15/2020   Suicidal ideation     Past Surgical History:  Procedure Laterality Date   ADENOIDECTOMY AND MYRINGOTOMY WITH TUBE PLACEMENT     CIRCUMCISION     PECTUS BAR INSERTION PEDIATRIC  2021   TYMPANOPLASTY     TYMPANOSTOMY TUBE PLACEMENT     WISDOM TOOTH EXTRACTION      Family Psychiatric History:   Family History:  Family History  Problem Relation Age of Onset   Allergies Mother    Asthma Maternal Grandmother    Allergies Maternal Grandmother    Heart disease Maternal Grandfather     Social History:  Social History   Socioeconomic History   Marital status: Single    Spouse name: Not on file   Number of children: 0   Years of education: Not on file   Highest education level: Some college, no degree  Occupational History   Not on file  Tobacco Use   Smoking status: Never    Passive exposure: Never   Smokeless tobacco: Never  Vaping Use  Vaping status: Never Used  Substance and Sexual Activity   Alcohol use: Not Currently    Comment: 2-3 beers weekly   Drug use: No   Sexual activity: Yes    Birth control/protection: None  Other Topics Concern   Not on file  Social History Narrative   Trystin is in ninth grade at [employer]. He is meeting most of the goals on his IEP.He struggles with math.   Quenton lives with his parents, and two brothers.   Social Drivers of Health   Tobacco Use: Low Risk (09/11/2024)   Patient History     Smoking Tobacco Use: Never    Smokeless Tobacco Use: Never    Passive Exposure: Never  Financial Resource Strain: Medium Risk (05/23/2024)   Overall Financial Resource Strain (CARDIA)    Difficulty of Paying Living Expenses: Somewhat hard  Food Insecurity: Food Insecurity Present (05/23/2024)   Epic    Worried About Programme Researcher, Broadcasting/film/video in the Last Year: Sometimes true    Ran Out of Food in the Last Year: Sometimes true  Transportation Needs: No Transportation Needs (05/23/2024)   Epic    Lack of Transportation (Medical): No    Lack of Transportation (Non-Medical): No  Physical Activity: Sufficiently Active (05/23/2024)   Exercise Vital Sign    Days of Exercise per Week: 4 days    Minutes of Exercise per Session: 40 min  Stress: Stress Concern Present (05/23/2024)   [employer] of Occupational Health - Occupational Stress Questionnaire    Feeling of Stress: Rather much  Social Connections: Unknown (05/23/2024)   Social Connection and Isolation Panel    Frequency of Communication with Friends and Family: More than three times a week    Frequency of Social Gatherings with Friends and Family: Once a week    Attends Religious Services: Patient declined    Active Member of Clubs or Organizations: No    Attends Engineer, Structural: Not on file    Marital Status: Patient declined  Depression (PHQ2-9): Low Risk (09/11/2024)   Depression (PHQ2-9)    PHQ-2 Score: 4  Recent Concern: Depression (PHQ2-9) - Medium Risk (07/10/2024)   Depression (PHQ2-9)    PHQ-2 Score: 8  Alcohol Screen: Low Risk (05/23/2024)   Alcohol Screen    Last Alcohol Screening Score (AUDIT): 5  Housing: Low Risk (05/23/2024)   Epic    Unable to Pay for Housing in the Last Year: No    Number of Times Moved in the Last Year: 0    Homeless in the Last Year: No  Utilities: Not on file  Health Literacy: Not on file    Allergies: Allergies[1]  Metabolic Disorder Labs: Lab Results  Component Value Date    HGBA1C 5.0 10/14/2020   MPG 96.8 10/14/2020   No results found for: PROLACTIN Lab Results  Component Value Date   CHOL 122 10/14/2020   TRIG 69 10/14/2020   HDL 45 10/14/2020   CHOLHDL 2.7 10/14/2020   VLDL 14 10/14/2020   LDLCALC 63 10/14/2020   Lab Results  Component Value Date   TSH 1.34 07/23/2021   TSH 1.733 10/14/2020    Therapeutic Level Labs: No results found for: LITHIUM No results found for: VALPROATE No results found for: CBMZ  Current Medications: Current Outpatient Medications  Medication Sig Dispense Refill   ARIPiprazole  (ABILIFY ) 5 MG tablet Take 1 tablet (5 mg total) by mouth daily. 90 tablet 0   hydrOXYzine  (ATARAX ) 25 MG tablet Take 1 tablet (  25 mg total) by mouth 3 (three) times daily as needed for anxiety. 30 tablet 0   loratadine (CLARITIN) 10 MG tablet Take 10 mg by mouth daily. (Patient not taking: Reported on 05/23/2024)     sertraline  (ZOLOFT ) 50 MG tablet Take 1 tablet (50 mg total) by mouth daily. 90 tablet 0   No current facility-administered medications for this visit.     Musculoskeletal: Strength & Muscle Tone: within normal limits Gait & Station: normal Patient leans: N/A  Psychiatric Specialty Exam: Review of Systems  Blood pressure 110/72, pulse 86, height 5' 9 (1.753 m), weight 156 lb (70.8 kg).Body mass index is 23.04 kg/m.  General Appearance: Casual  Eye Contact:  Good  Speech:  Clear and Coherent  Volume:  Normal  Mood:  Euthymic  Affect:  Congruent  Thought Process:  Coherent  Orientation:  Full (Time, Place, and Person)  Thought Content: Logical   Suicidal Thoughts:  No  Homicidal Thoughts:  No  Memory:  Immediate;   Good Recent;   Good  Judgement:  Fair  Insight:  Good  Psychomotor Activity:  Normal  Concentration:  Concentration: Fair  Recall:  Good  Fund of Knowledge: Good  Language: Good  Akathisia:  No  Handed:  Right  AIMS (if indicated): not done  Assets:  Communication Skills Desire for  Improvement  ADL's:  Intact  Cognition: WNL  Sleep:  Good   Screenings: AIMS    Flowsheet Row [facility]  AIMS Total Score 0   AUDIT    Flowsheet Row Office Visit from 05/23/2024 in [facility] [place] Office Visit from 11/05/2023 in [facility] [place] Admission (Discharged) [facility]  Alcohol Use Disorder Identification Test Final Score (AUDIT) 5  5  3    GAD-7    Flowsheet Row Office Visit from 05/23/2024 in [facility] [place] [facility] Office Visit from 11/05/2023 in [facility] [place] [facility] Office Visit from 11/10/2022 in [facility] Office Visit from 07/15/2020 in [facility]  Total GAD-7 Score 9 5 8 6    PHQ2-9    Flowsheet Row [facility] Visit [facility] 09[facility]11/2024 [facility] Office Visit from 07/10/2024 [facility] Office Visit from 05/23/2024 in [facility] Office Visit from 11/05/2023 in [facility] [place] Office Visit from 07/06/2023 in [facility] [place] [place]  PHQ-2 Total Score 2 3 3 1  0  PHQ-9 Total Score 4 8 11 3 3    Flowsheet Row ED from 06/30/2023 in [facility] at [facility] ED from 11/04/2022 in [facility] Department at [facility] ED from 07/17/2021 in [facility] Department at [facility]  C-SSRS RISK CATEGORY No Risk No Risk No Risk     Assessment and Plan: Ivar Domangue 24 year old male presents for medication management follow-up appointment.  Initial appointment 07/2024 was continued on Abilify  10 mg, hydroxyzine  25 and Zoloft .  Stated when taking medications as  directed he was able to tolerate.  Denied suicidal or homicidal ideation denied auditory visual hallucinations.  Rating his mood depression 4 out of 10 with 10 being the worst.  No concerns related to suicidal ideations.  Discussed restarting medications and following  up 2 to 3 months for adherence/tolerability.  Encouraged to keep therapist follow-up appointment.  He was amendable to plan.  Support, encouragement reassurance was provided  Collaboration of Care: Collaboration of Care: Medication Management AEB Zoloft , Abilify  and hydroxyzine  as needed  Patient/Guardian was advised Release of Information must be obtained prior to any record release in order to collaborate their care with an outside provider. Patient/Guardian was advised if they have not already done so to contact the registration department to sign all necessary forms in order for us  to release information regarding their care.   Consent: Patient/Guardian gives verbal consent for treatment and assignment of benefits for services provided during this visit. Patient/Guardian expressed understanding and agreed to proceed.    Staci LOISE Kerns, NP 09/11/2024, 4:01 PM     [1]  Allergies Allergen Reactions   Other Cough    Seasonal Allergies in Spring & Fall  Seasonal Allergies in Spring & Fall

## 2024-12-11 ENCOUNTER — Ambulatory Visit (HOSPITAL_COMMUNITY): Admitting: Family
